# Patient Record
Sex: Male | Born: 1980
Health system: Southern US, Community
[De-identification: ages and names within clinical notes are randomized; demographics above are authoritative.]

## PROBLEM LIST (undated history)

## (undated) DIAGNOSIS — N159 Renal tubulo-interstitial disease, unspecified: Secondary | ICD-10-CM

## (undated) DIAGNOSIS — G935 Compression of brain: Secondary | ICD-10-CM

## (undated) DIAGNOSIS — Z889 Allergy status to unspecified drugs, medicaments and biological substances status: Secondary | ICD-10-CM

## (undated) DIAGNOSIS — K819 Cholecystitis, unspecified: Secondary | ICD-10-CM

## (undated) DIAGNOSIS — K200 Eosinophilic esophagitis: Secondary | ICD-10-CM

## (undated) DIAGNOSIS — K219 Gastro-esophageal reflux disease without esophagitis: Secondary | ICD-10-CM

## (undated) HISTORY — PX: ESOPHAGOGASTRODUODENOSCOPY: SHX1529

## (undated) HISTORY — DX: Gastro-esophageal reflux disease without esophagitis: K21.9

---
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
## 2004-02-23 HISTORY — PX: CHOLECYSTECTOMY: SHX55

## 2011-04-18 ENCOUNTER — Emergency Department: Payer: Self-pay | Admitting: Emergency Medicine

## 2011-05-03 DIAGNOSIS — R519 Headache, unspecified: Secondary | ICD-10-CM | POA: Insufficient documentation

## 2011-06-16 DIAGNOSIS — G935 Compression of brain: Secondary | ICD-10-CM

## 2011-06-16 HISTORY — DX: Compression of brain: G93.5

## 2012-04-13 ENCOUNTER — Ambulatory Visit: Payer: Self-pay

## 2012-04-13 LAB — CBC WITH DIFFERENTIAL/PLATELET
Basophil #: 0 10*3/uL (ref 0.0–0.1)
Basophil %: 0.2 %
Eosinophil #: 0.1 10*3/uL (ref 0.0–0.7)
Eosinophil %: 1 %
HGB: 17.5 g/dL (ref 13.0–18.0)
Lymphocyte #: 0.5 10*3/uL — ABNORMAL LOW (ref 1.0–3.6)
Lymphocyte %: 3.4 %
RBC: 5.41 10*6/uL (ref 4.40–5.90)
RDW: 13.1 % (ref 11.5–14.5)
WBC: 13.1 10*3/uL — ABNORMAL HIGH (ref 3.8–10.6)

## 2012-04-13 LAB — URINALYSIS, COMPLETE
Blood: NEGATIVE
Protein: 100

## 2012-04-13 LAB — AMYLASE: Amylase: 64 U/L (ref 25–115)

## 2012-04-13 LAB — COMPREHENSIVE METABOLIC PANEL
Albumin: 4.9 g/dL (ref 3.4–5.0)
Alkaline Phosphatase: 122 U/L (ref 50–136)
Anion Gap: 12 (ref 7–16)
BUN: 22 mg/dL — ABNORMAL HIGH (ref 7–18)
Bilirubin,Total: 3 mg/dL — ABNORMAL HIGH (ref 0.2–1.0)
Calcium, Total: 10 mg/dL (ref 8.5–10.1)
Co2: 28 mmol/L (ref 21–32)
EGFR (African American): >60
Glucose: 119 mg/dL — ABNORMAL HIGH (ref 65–99)
Osmolality: 280 (ref 275–301)
Potassium: 4.1 mmol/L (ref 3.5–5.1)
SGOT(AST): 32 U/L (ref 15–37)
Total Protein: 9.2 g/dL — ABNORMAL HIGH (ref 6.4–8.2)

## 2012-04-13 LAB — LIPASE, BLOOD: Lipase: 92 U/L (ref 73–393)

## 2012-04-15 LAB — URINE CULTURE

## 2012-06-05 ENCOUNTER — Ambulatory Visit: Payer: Self-pay | Admitting: Gastroenterology

## 2012-06-07 LAB — PATHOLOGY REPORT

## 2013-01-17 ENCOUNTER — Ambulatory Visit: Payer: Self-pay | Admitting: Orthopedic Surgery

## 2013-01-24 ENCOUNTER — Encounter: Payer: Self-pay | Admitting: Orthopedic Surgery

## 2013-02-22 ENCOUNTER — Encounter: Payer: Self-pay | Admitting: Orthopedic Surgery

## 2013-12-19 ENCOUNTER — Encounter: Payer: Self-pay | Admitting: Podiatry

## 2013-12-23 ENCOUNTER — Encounter: Payer: Self-pay | Admitting: Podiatry

## 2014-01-22 ENCOUNTER — Encounter: Payer: Self-pay | Admitting: Podiatry

## 2014-01-30 ENCOUNTER — Ambulatory Visit: Payer: Self-pay | Admitting: Gastroenterology

## 2014-02-22 ENCOUNTER — Encounter: Payer: Self-pay | Admitting: Podiatry

## 2014-06-17 LAB — SURGICAL PATHOLOGY

## 2014-10-07 ENCOUNTER — Encounter: Payer: Self-pay | Admitting: Family Medicine

## 2014-10-07 ENCOUNTER — Ambulatory Visit (INDEPENDENT_AMBULATORY_CARE_PROVIDER_SITE_OTHER): Payer: Self-pay | Admitting: Family Medicine

## 2014-10-07 ENCOUNTER — Other Ambulatory Visit (INDEPENDENT_AMBULATORY_CARE_PROVIDER_SITE_OTHER): Payer: 59

## 2014-10-07 VITALS — BP 130/70 | HR 74 | Wt 171.0 lb

## 2014-10-07 DIAGNOSIS — M25571 Pain in right ankle and joints of right foot: Secondary | ICD-10-CM | POA: Diagnosis not present

## 2014-10-07 NOTE — Progress Notes (Signed)
[facility] [address] [place], [place] [address] Phone: [phone] Subjective:    I'm seeing this patient by the request  of:  Charles Edward, MD   CC: right ankle  [id number] [facility] is a 34 y.o. male coming in with complaint of right ankle pain. Played soccer in May inversion injury, weakness immediatly and swelling with bruising but did get better overtime.  Now running and trying to increase mileage for marathon in October.  Pain laterally and medial and now more constant dull and throbbing at all times with some sharp pain.  Continue to run. Also workout and biking regularly.  Been icing and bracing and Nsaids with improvement. Patient states actually when he tries to run he does get some swelling still. Denies any giving out on him but states that something just does not feel right. Unable to actually increase his time secondary to the pain. Patient remains active multiple different exercises multiple different times a week.  No past medical history on file. No past surgical history on file. Social History  Substance Use Topics  . Smoking status: Not on file  . Smokeless tobacco: Not on file  . Alcohol Use: Not on file   Not on File No family history on file.      Past medical history, social, surgical and family history all reviewed in electronic medical record.   Review of Systems: No headache, visual changes, nausea, vomiting, diarrhea, constipation, dizziness, abdominal pain, skin rash, fevers, chills, night sweats, weight loss, swollen lymph nodes, body aches, joint swelling, muscle aches, chest pain, shortness of breath, mood changes.   Objective Blood pressure 130/70, pulse 74, weight 171 lb (77.565 kg), SpO2 98 %.  General: No apparent distress alert and oriented x3 mood and affect normal, dressed appropriately.  HEENT: Pupils equal, extraocular movements intact  Respiratory: Patient's speak in full sentences and does  not appear short of breath  Cardiovascular: No lower extremity edema, non tender, no erythema  Skin: Warm dry intact with no signs of infection or rash on extremities or on axial skeleton.  Abdomen: Soft nontender  Neuro: Cranial nerves II through XII are intact, neurovascularly intact in all extremities with 2+ DTRs and 2+ pulses.  Lymph: No lymphadenopathy of posterior or anterior cervical chain or axillae bilaterally.  Gait normal with good balance and coordination.  MSK:  Non tender with full range of motion and good stability and symmetric strength and tone of shoulders, elbows, wrist, hip, knees bilaterally.   Ankle: right  Range of motion is full in all directions. Strength is 5/5 in all directions. Stable lateral and medial ligaments; squeeze test and kleiger test unremarkable; Talar dome Moderately tender No pain at base of 5th MT; No tenderness over cuboid; No tenderness over N spot or navicular prominence No tenderness on posterior aspects of lateral and medial malleolus No sign of peroneal tendon subluxations or tenderness to palpation Negative tarsal tunnel tinel's Able to walk 4 steps.  MSK US performed of: right  This study was ordered, performed, and interpreted by Terrilee Files D.O.  Foot/Ankle:   All structures visualized.   Talar dome does have some hypoechoic changes and possible cortical defect noted with mild increasing Doppler flow Ankle mortise trace effusion possible synovitis noted Peroneus longus and brevis tendons unremarkable on long and transverse views without sheath effusions. Posterior tibialis, flexor hallucis longus, and flexor digitorum longus tendons unremarkable on long and transverse views without sheath effusions. Achilles tendon  visualized along length of tendon and unremarkable on long and transverse views without sheath effusion. Anterior Talofibular Ligament and Calcaneofibular Ligaments unremarkable and intact. Deltoid Ligament unremarkable  and intact. Plantar fascia intact and without effusion, normal thickness. No increased doppler signal, cap sign, or thickening of tibial cortex. Power doppler signal normal.  IMPRESSION: possible anterior impingement of the ankle versus talar dome injury       Impression and Recommendations:     This case required medical decision making of moderate complexity.

## 2014-10-07 NOTE — Assessment & Plan Note (Signed)
Patient's right ankle pain continues to give him trouble 3 months after injury. Patient continues to have a recurrent effusion when he does different activities states. There is concern on ultrasound that patient may have a talar dome injury but patient is able to still run 14 miles at a time. Discussed about the potential for an anterior impingement as well. Patient will try conservative therapy including home exercises, continue bracing, anti-inflammatory's and icing. Patient will limit the amount of high intensity exercises he does for the next 2 weeks and then follow-up with me in 3 weeks for further evaluation and treatment. Continuing to have trouble will consider further imaging including x-ray.

## 2014-10-07 NOTE — Patient Instructions (Addendum)
Good to see you.  Ice 20 minutes 2 times daily. Usually after activity and before bed. Exercises 3 times a week.  Avoid running or jumping.  When exercising wear the brace  Vitamin D 4000 IU daily until I see you again.  In week 3 ok to start running again but 50% of what you are doing now in duration and frequency.  See me in 3 weeks and we will make sure you are doing well.

## 2014-10-07 NOTE — Progress Notes (Signed)
Pre visit review using our clinic review tool, if applicable. No additional management support is needed unless otherwise documented below in the visit note. 

## 2014-10-30 ENCOUNTER — Ambulatory Visit (INDEPENDENT_AMBULATORY_CARE_PROVIDER_SITE_OTHER)
Admission: RE | Admit: 2014-10-30 | Discharge: 2014-10-30 | Disposition: A | Discharge status: Home / Self Care | Payer: 59 | Source: Ambulatory Visit | Attending: Family Medicine | Admitting: Family Medicine

## 2014-10-30 ENCOUNTER — Encounter: Payer: Self-pay | Admitting: Family Medicine

## 2014-10-30 ENCOUNTER — Ambulatory Visit (INDEPENDENT_AMBULATORY_CARE_PROVIDER_SITE_OTHER): Payer: 59 | Admitting: Family Medicine

## 2014-10-30 VITALS — BP 134/78 | HR 77 | Wt 173.0 lb

## 2014-10-30 DIAGNOSIS — M25571 Pain in right ankle and joints of right foot: Secondary | ICD-10-CM

## 2014-10-30 NOTE — Progress Notes (Signed)
[facility] [address] [place], [place] [address] Phone: [phone] Subjective:    CC: right ankle follow up  Charles Simmons is a 34 y.o. male coming in with complaint of right ankle pain. Patient was found to have potentially a talar dome problem. Patient has been avoiding any type of a higher intensity exercises including running but continues biking. Denies any  Improvement in the pain. Patient states that it continues to give him discomfort. States that even at night he notices the discomfort. States that maybe the swelling seems to be somewhat better. Denies any new symptoms such as numbness or weakness. Patient though is training over the course the next 3 weeks.  No past medical history on file. No past surgical history on file. Social History  Substance Use Topics  . Smoking status: Not on file  . Smokeless tobacco: Not on file  . Alcohol Use: Not on file   Not on File No family history on file.      Past medical history, social, surgical and family history all reviewed in electronic medical record.   Review of Systems: No headache, visual changes, nausea, vomiting, diarrhea, constipation, dizziness, abdominal pain, skin rash, fevers, chills, night sweats, weight loss, swollen lymph nodes, body aches, joint swelling, muscle aches, chest pain, shortness of breath, mood changes.   Objective Blood pressure 134/78, pulse 77, weight 173 lb (78.472 kg), SpO2 96 %.  General: No apparent distress alert and oriented x3 mood and affect normal, dressed appropriately.  HEENT: Pupils equal, extraocular movements intact  Respiratory: Patient's speak in full sentences and does not appear short of breath  Cardiovascular: No lower extremity edema, non tender, no erythema  Skin: Warm dry intact with no signs of infection or rash on extremities or on axial skeleton.  Abdomen: Soft nontender  Neuro: Cranial nerves II through XII are intact,  neurovascularly intact in all extremities with 2+ DTRs and 2+ pulses.  Lymph: No lymphadenopathy of posterior or anterior cervical chain or axillae bilaterally.  Gait normal with good balance and coordination.  MSK:  Non tender with full range of motion and good stability and symmetric strength and tone of shoulders, elbows, wrist, hip, knees bilaterally.   Ankle: right  Patient does have some limitation in range of motion lacking the last 5 of dorsiflexion and plantarflexion this is new from previous exam Strength is 5/5 in all directions. Stable lateral and medial ligaments; squeeze test and kleiger test unremarkable; Talar dome Moderately tender still present No pain at base of 5th MT; No tenderness over cuboid; No tenderness over N spot or navicular prominence No tenderness on posterior aspects of lateral and medial malleolus No sign of peroneal tendon subluxations or tenderness to palpation Negative tarsal tunnel tinel's Able to walk 4 steps.   MSK US performed of: right  This study was ordered, performed, and interpreted by Terrilee Files D.O.  Foot/Ankle:   All structures visualized.   Talar dome does have some hypoechoic changes and possible cortical defect noted with mild increasing Doppler flow Ankle mortise with decreased effusion with possible talar dome continued hypoechoic changes noted.Marland Kitchen Posterior tibialis, flexor hallucis longus, and flexor digitorum longus tendons unremarkable on long and transverse views without sheath effusions. Achilles tendon visualized along length of tendon and unremarkable on long and transverse views without sheath effusion. Anterior Talofibular Ligament and Calcaneofibular Ligaments unremarkable and intact. Deltoid Ligament unremarkable and intact. Plantar fascia intact and without effusion, normal thickness. No  increased doppler signal, cap sign, or thickening of tibial cortex. Power doppler signal normal.  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

## 2014-10-30 NOTE — Assessment & Plan Note (Addendum)
Patient has not made any significant improvement with conservative therapy at this time. I'm concern for possible OCD. I do feel that further imaging is warranted an x-ray was ordered today. Patient has had limitation in the range of motion and I do think that advance imaging would be warranted as well. Patient will continue with the icing and the over-the-counter natural supplementations. Patient is going to start increasing his training again. We will see patient back again 1-2 days after the MRI and we'll discuss possible treatment accordingly. Goal of running marathon.  Spent  25 minutes with patient face-to-face and had greater than 50% of counseling including as described above in assessment and plan.

## 2014-10-30 NOTE — Patient Instructions (Addendum)
It is good to see you Lets get xray downstairs MRI ordered to give [place] more info.  Ice is your friend 25% of run this week and then increase 25% a week.  Good luck with [place] unless I say otherwise.  Continue the vitamins Look into K2 as another vitamin

## 2014-10-31 ENCOUNTER — Encounter: Payer: Self-pay | Admitting: Family Medicine

## 2014-10-31 ENCOUNTER — Ambulatory Visit
Admission: RE | Admit: 2014-10-31 | Discharge: 2014-10-31 | Disposition: A | Discharge status: Home / Self Care | Payer: 59 | Source: Ambulatory Visit | Attending: Family Medicine | Admitting: Family Medicine

## 2014-10-31 DIAGNOSIS — M24271 Disorder of ligament, right ankle: Secondary | ICD-10-CM | POA: Insufficient documentation

## 2014-10-31 DIAGNOSIS — M25571 Pain in right ankle and joints of right foot: Secondary | ICD-10-CM | POA: Diagnosis present

## 2014-10-31 DIAGNOSIS — M67873 Other specified disorders of tendon, right ankle and foot: Secondary | ICD-10-CM | POA: Diagnosis not present

## 2014-11-07 ENCOUNTER — Ambulatory Visit: Payer: 59

## 2014-11-08 ENCOUNTER — Other Ambulatory Visit (INDEPENDENT_AMBULATORY_CARE_PROVIDER_SITE_OTHER): Payer: 59

## 2014-11-08 ENCOUNTER — Encounter: Payer: Self-pay | Admitting: Family Medicine

## 2014-11-08 ENCOUNTER — Ambulatory Visit (INDEPENDENT_AMBULATORY_CARE_PROVIDER_SITE_OTHER): Payer: 59 | Admitting: Family Medicine

## 2014-11-08 VITALS — BP 124/80 | HR 67 | Wt 173.0 lb

## 2014-11-08 DIAGNOSIS — M7671 Peroneal tendinitis, right leg: Secondary | ICD-10-CM | POA: Insufficient documentation

## 2014-11-08 DIAGNOSIS — M25571 Pain in right ankle and joints of right foot: Secondary | ICD-10-CM

## 2014-11-08 NOTE — Progress Notes (Signed)
[name] [address] [place], [place] [address] Phone: [phone] Subjective:    CC: right ankle follow up  Charles Simmons is a 34 y.o. male coming in with complaint of right ankle pain. Patient continued to have right ankle pain and initially had significant amount swelling. Patient continued and pain over the talar dome itself appeared. Patient was sent for an MRI. MRI was reviewed by me. Patient did have severe thickening of the anterior tibiofibular ligament as well as some focal tendinosis of the peroneal brevis and appeared the patient also had a tertiary peroneal tendon anterior to the lateral fibula. Patient continues to have pain he states. Is trying to run and is doing relatively well. More discomfort still on the anterior aspect of the ankle but no significant swelling.  No past medical history on file. No past surgical history on file. Social History  Substance Use Topics  . Smoking status: Never Smoker   . Smokeless tobacco: None  . Alcohol Use: None   Not on File No family history on file.      Past medical history, social, surgical and family history all reviewed in electronic medical record.   Review of Systems: No headache, visual changes, nausea, vomiting, diarrhea, constipation, dizziness, abdominal pain, skin rash, fevers, chills, night sweats, weight loss, swollen lymph nodes, body aches, joint swelling, muscle aches, chest pain, shortness of breath, mood changes.   Objective Blood pressure 124/80, pulse 67, weight 173 lb (78.472 kg), SpO2 97 %.  General: No apparent distress alert and oriented x3 mood and affect normal, dressed appropriately.  HEENT: Pupils equal, extraocular movements intact  Respiratory: Patient's speak in full sentences and does not appear short of breath  Cardiovascular: No lower extremity edema, non tender, no erythema  Skin: Warm dry intact with no signs of infection or rash on  extremities or on axial skeleton.  Abdomen: Soft nontender  Neuro: Cranial nerves II through XII are intact, neurovascularly intact in all extremities with 2+ DTRs and 2+ pulses.  Lymph: No lymphadenopathy of posterior or anterior cervical chain or axillae bilaterally.  Gait normal with good balance and coordination.  MSK:  Non tender with full range of motion and good stability and symmetric strength and tone of shoulders, elbows, wrist, hip, knees bilaterally.   Ankle: right  Still tightness but increased range of motion from previous exam. Strength is 5/5 in all directions. Stable lateral and medial ligaments; squeeze test and kleiger test unremarkable; Talar dome and over the tertiary peroneal tendon No pain at base of 5th MT; No tenderness over cuboid; No tenderness over N spot or navicular prominence No tenderness on posterior aspects of lateral and medial malleolus No sign of peroneal tendon subluxations or tenderness to palpation Negative tarsal tunnel tinel's Able to walk 4 steps.   Procedure: Real-time Ultrasound Guided Injection of right ankle tertiary peroneal tendon sheath Device: GE Logiq E  Ultrasound guided injection is preferred based studies that show increased duration, increased effect, greater accuracy, decreased procedural pain, increased response rate, and decreased cost with ultrasound guided versus blind injection.  Verbal informed consent obtained.  Time-out conducted.  Noted no overlying erythema, induration, or other signs of local infection.  Skin prepped in a sterile fashion.  Local anesthesia: Topical Ethyl chloride.  With sterile technique and under real time ultrasound guidance: With a 25-gauge half-inch needle patient was injected with a total of 0.5 mL of 0.5% Marcaine and 0.5 mL of Kenalog  40 mg/dL  Completed without difficulty  Pain immediately resolved suggesting accurate placement of the medication.  Advised to call if fevers/chills, erythema,  induration, drainage, or persistent bleeding.  Images permanently stored and available for review in the ultrasound unit.  Impression: Technically successful ultrasound guided injection.       Impression and Recommendations:     This case required medical decision making of moderate complexity.

## 2014-11-08 NOTE — Progress Notes (Signed)
Pre visit review using our clinic review tool, if applicable. No additional management support is needed unless otherwise documented below in the visit note. 

## 2014-11-08 NOTE — Assessment & Plan Note (Signed)
Patient injected today and tolerated procedure well. We discussed icing regimen and home exercises. We discussed which activities to doing which was potentially avoid. Patient will try to make these different changes and come back and see me again after his race.

## 2014-11-08 NOTE — Patient Instructions (Signed)
Good to see you Ice is your friend Ice after running always.  Continue the exercises and continue the regimen we discussed.  Injection was marcaine and kenalog  We injected tertiary peroneal tendon.  See me again after the race!

## 2014-12-03 ENCOUNTER — Encounter: Payer: Self-pay | Admitting: Family Medicine

## 2014-12-03 ENCOUNTER — Ambulatory Visit (INDEPENDENT_AMBULATORY_CARE_PROVIDER_SITE_OTHER): Payer: 59 | Admitting: Family Medicine

## 2014-12-03 VITALS — BP 120/74 | HR 55

## 2014-12-03 DIAGNOSIS — M6281 Muscle weakness (generalized): Secondary | ICD-10-CM | POA: Diagnosis not present

## 2014-12-03 DIAGNOSIS — Z23 Encounter for immunization: Secondary | ICD-10-CM | POA: Diagnosis not present

## 2014-12-03 NOTE — Progress Notes (Signed)
Pre visit review using our clinic review tool, if applicable. No additional management support is needed unless otherwise documented below in the visit note. 

## 2014-12-03 NOTE — Progress Notes (Signed)
  [name] [address] [place], [place] [address] Phone: [phone] Subjective:    CC: right ankle follow up  [id number] COYT GOVONI is a 34 y.o. male coming in with complaint of right ankle pain. Patient continued to have right ankle pain and initially had significant amount swelling. Patient continued and pain over the talar dome itself appeared. Patient was sent for an MRI. MRI was reviewed by me. Patient did have severe thickening of the anterior tibiofibular ligament as well as some focal tendinosis of the peroneal brevis .  Patient was given an injection and states that he was doing somewhat better for some time and then ran the marathon. When patient ran the marathon he had increasing pain and stiffness for multiple weeks. Seems to be improving at this time. His avoiding any significant running other than his regular routine. Denies any new symptoms. Patient is taking a break from any long-distance running for quite some time and would like to heal himself over the course of time.  No past medical history on file. No past surgical history on file. Social History  Substance Use Topics  . Smoking status: Never Smoker   . Smokeless tobacco: None  . Alcohol Use: None   Not on File No family history on file.      Past medical history, social, surgical and family history all reviewed in electronic medical record.   Review of Systems: No headache, visual changes, nausea, vomiting, diarrhea, constipation, dizziness, abdominal pain, skin rash, fevers, chills, night sweats, weight loss, swollen lymph nodes, body aches, joint swelling, muscle aches, chest pain, shortness of breath, mood changes.   Objective Blood pressure 120/74, pulse 55, SpO2 98 %.  General: No apparent distress alert and oriented x3 mood and affect normal, dressed appropriately.  HEENT: Pupils equal, extraocular movements intact  Respiratory: Patient's speak in full sentences  and does not appear short of breath  Cardiovascular: No lower extremity edema, non tender, no erythema  Skin: Warm dry intact with no signs of infection or rash on extremities or on axial skeleton.  Abdomen: Soft nontender  Neuro: Cranial nerves II through XII are intact, neurovascularly intact in all extremities with 2+ DTRs and 2+ pulses.  Lymph: No lymphadenopathy of posterior or anterior cervical chain or axillae bilaterally.  Gait normal with good balance and coordination.  MSK:  Non tender with full range of motion and good stability and symmetric strength and tone of shoulders, elbows, wrist, hip, knees bilaterally.   Patient did bring video of patient's running the [facility] and patient's left calf does not contract.  Ankle: right  Increased range of motion from previous exam. Strength is 5/5 in all directions. Stable lateral and medial ligaments; squeeze test and kleiger test unremarkable; Pain over the peroneal tendon still noted No pain at base of 5th MT; No tenderness over cuboid; No tenderness over N spot or navicular prominence No tenderness on posterior aspects of lateral and medial malleolus No sign of peroneal tendon subluxations or tenderness to palpation Negative tarsal tunnel tinel's Able to walk 4 steps. Left calf muscle on exam seems to do all right with sitting and strength testing.    Impression and Recommendations:     This case required medical decision making of moderate complexity.

## 2014-12-03 NOTE — Patient Instructions (Signed)
Good to see you and congrats!!! New exercises for the calf On step drop heels as far as you can then up on toes, hold 2 seconds down slow for count of 4 seconds, 30 reps daily for 1st week, then 2 sets daily for 2nd week and then 3 sets daily thereafter OK to bike and to elliptical.  Ice at the end of exercises Consider a TENS unit at this time. [employer] See me again in 4 weeks

## 2014-12-03 NOTE — Assessment & Plan Note (Signed)
Believe that actually patient's ankle pain is secondary to his calf not firing correctly. We discussed with patient about treating the contralateral calf muscle. Patient given home exercises, icing protocol, we discussed compression sleeve as well as possible electrical stimulation I could be beneficial. Patient is to try to make these changes and come back in 4 weeks for further evaluation and will given exercise prescription to increase his activity as tolerated.

## 2014-12-28 ENCOUNTER — Emergency Department
Admission: EM | Admit: 2014-12-28 | Discharge: 2014-12-28 | Disposition: A | Discharge status: Home / Self Care | Payer: 59 | Attending: Emergency Medicine | Admitting: Emergency Medicine

## 2014-12-28 ENCOUNTER — Encounter: Payer: Self-pay | Admitting: Emergency Medicine

## 2014-12-28 DIAGNOSIS — Z23 Encounter for immunization: Secondary | ICD-10-CM | POA: Insufficient documentation

## 2014-12-28 DIAGNOSIS — W260XXA Contact with knife, initial encounter: Secondary | ICD-10-CM | POA: Insufficient documentation

## 2014-12-28 DIAGNOSIS — Z79899 Other long term (current) drug therapy: Secondary | ICD-10-CM | POA: Insufficient documentation

## 2014-12-28 DIAGNOSIS — Z7951 Long term (current) use of inhaled steroids: Secondary | ICD-10-CM | POA: Diagnosis not present

## 2014-12-28 DIAGNOSIS — Y9389 Activity, other specified: Secondary | ICD-10-CM | POA: Insufficient documentation

## 2014-12-28 DIAGNOSIS — Y998 Other external cause status: Secondary | ICD-10-CM | POA: Diagnosis not present

## 2014-12-28 DIAGNOSIS — Y9289 Other specified places as the place of occurrence of the external cause: Secondary | ICD-10-CM | POA: Diagnosis not present

## 2014-12-28 DIAGNOSIS — S61012A Laceration without foreign body of left thumb without damage to nail, initial encounter: Secondary | ICD-10-CM | POA: Insufficient documentation

## 2014-12-28 MED ORDER — TETANUS-DIPHTH-ACELL PERTUSSIS 5-2.5-18.5 LF-MCG/0.5 IM SUSP
0.5000 mL | Freq: Once | INTRAMUSCULAR | Status: AC
  Administered 2014-12-28: 0.5 mL via INTRAMUSCULAR
  Filled 2014-12-28: qty 0.5

## 2014-12-28 NOTE — ED Notes (Signed)
Discussed discharge instructions and follow-up care with patient. No questions or concerns at this time. Pt stable at discharge.  

## 2014-12-28 NOTE — ED Notes (Signed)
Pt reports left thumb with kitchen knife. Pt reports numbness to end of thumb. Pt reports it's been bleeding for 45 minutes; unsure of last tetanus shot.

## 2014-12-28 NOTE — ED Provider Notes (Signed)
Minimally Invasive Surgery [facility] Emergency Department Provider Note  ____________________________________________  Time seen: Approximately 4:24 PM  I have reviewed the triage vital signs and the nursing notes.   HISTORY  Chief Complaint Laceration  HPI Charles Simmons is a 34 y.o. male is here with laceration to his left thumb with a kitchen knife this afternoon. Patient states that he applied pressure but area continued to bleed. He is also unsure of his last tetanus shot. Bleeding is now controlled with pressure. His pain level is a 6 out of 10.   History reviewed. No pertinent past medical history.  Patient Active Problem List   Diagnosis Date Noted  . Calf muscle weakness 12/03/2014  . Peroneal tendinitis of right lower extremity 11/08/2014  . Right ankle pain 10/07/2014  . Arnold-Chiari malformation, type I (HCC) 06/16/2011  . Cephalalgia 05/03/2011    Past Surgical History  Procedure Laterality Date  . Cholecystectomy  2006    Current Outpatient Rx  Name  Route  Sig  Dispense  Refill  . cetirizine (ZYRTEC) 10 MG tablet   Oral   Take by mouth.         . fluticasone (FLONASE) 50 MCG/ACT nasal spray   Nasal   Place into the nose.         Marland Kitchen. omeprazole-sodium bicarbonate (ZEGERID) 40-1100 MG per capsule   Oral   Take by mouth.           Allergies Review of patient's allergies indicates not on file.  No family history on file.  Social History Social History  Substance Use Topics  . Smoking status: Never Smoker   . Smokeless tobacco: None  . Alcohol Use: No    Review of Systems Constitutional: No fever/chills Cardiovascular: Denies chest pain. Respiratory: Denies shortness of breath. Gastrointestinal:  No nausea, no vomiting.   Musculoskeletal: Negative for back pain. Skin: Positive laceration Neurological: Negative for headaches, focal weakness or numbness.  10-point ROS otherwise  negative.  ____________________________________________   PHYSICAL EXAM:  VITAL SIGNS: ED Triage Vitals  Enc Vitals Group     BP 12/28/14 1533 144/87 mmHg     Pulse Rate 12/28/14 1533 79     Resp 12/28/14 1533 16     Temp 12/28/14 1533 97.6 F (36.4 C)     Temp Source 12/28/14 1533 Oral     SpO2 12/28/14 1533 98 %     Weight 12/28/14 1533 160 lb (72.576 kg)     Height --      Head Cir --      Peak Flow --      Pain Score 12/28/14 1533 6     Pain Loc --      Pain Edu? --      Excl. in GC? --     Constitutional: Alert and oriented. Well appearing and in no acute distress. Eyes: Conjunctivae are normal. PERRL. EOMI. Head: Atraumatic. Nose: No congestion/rhinnorhea. Neck: No stridor.   Cardiovascular: Normal rate, regular rhythm. Grossly normal heart sounds.  Good peripheral circulation. Respiratory: Normal respiratory effort.  No retractions. Lungs CTAB. Gastrointestinal: Soft and nontender. No distention. Musculoskeletal: No lower extremity tenderness nor edema.  No joint effusions. Neurologic:  Normal speech and language. No gross focal neurologic deficits are appreciated. No gait instability. Skin:  Skin is warm, dry and intact. There is a very superficial flap laceration at the distal portion of the left thumb. No active bleeding at present. Psychiatric: Mood and affect are normal. Speech and behavior  are normal.  ____________________________________________   LABS (all labs ordered are listed, but only abnormal results are displayed)  Labs Reviewed - No data to display    PROCEDURES  Procedure(s) performed:LACERATION REPAIR Performed by: Tommi Rumps Authorized by: Tommi Rumps Consent: Verbal consent obtained. Risks and benefits: risks, benefits and alternatives were discussed Consent given by: patient Patient identity confirmed: provided demographic data Prepped and Draped in normal sterile fashion Wound explored  Laceration Location: Left  thumb distal portion  Laceration Length: 1 cm flap  No Foreign Bodies seen or palpated No anesthesia was used during this procedure. Irrigation method: syringe Amount of cleaning: standard  Skin closure: 5-0 Ethilon   Number of sutures: 1   Technique: Simple interrupted   Patient tolerance: Patient tolerated the procedure well with no immediate complications.  Critical Care performed: No  ____________________________________________   INITIAL IMPRESSION / ASSESSMENT AND PLAN / ED COURSE  Pertinent labs & imaging results that were available during my care of the patient were reviewed by me and considered in my medical decision making (see chart for details).  Patient was given information on how to take care of his laceration site. He was instructed to return to the emergency room or go to his family doctor for suture removal in 10 days. He may also take Tylenol or ibuprofen as needed for pain. ____________________________________________   FINAL CLINICAL IMPRESSION(S) / ED DIAGNOSES  Final diagnoses:  Laceration of left thumb, initial encounter      Tommi Rumps, PA-C 12/28/14 1653  Jene Every, MD 12/28/14 [phone]

## 2014-12-28 NOTE — Discharge Instructions (Signed)

## 2015-06-02 ENCOUNTER — Ambulatory Visit: Payer: Self-pay | Admitting: Physician Assistant

## 2015-06-02 ENCOUNTER — Ambulatory Visit
Admission: RE | Admit: 2015-06-02 | Discharge: 2015-06-02 | Disposition: A | Discharge status: Home / Self Care | Payer: 59 | Source: Ambulatory Visit | Attending: Physician Assistant | Admitting: Physician Assistant

## 2015-06-02 ENCOUNTER — Encounter: Payer: Self-pay | Admitting: Physician Assistant

## 2015-06-02 VITALS — BP 119/80 | HR 72 | Temp 97.1°F

## 2015-06-02 DIAGNOSIS — M79644 Pain in right finger(s): Secondary | ICD-10-CM | POA: Diagnosis not present

## 2015-06-02 NOTE — Progress Notes (Signed)
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

## 2015-07-24 DIAGNOSIS — Z8371 Family history of colonic polyps: Secondary | ICD-10-CM | POA: Diagnosis not present

## 2015-07-24 DIAGNOSIS — K219 Gastro-esophageal reflux disease without esophagitis: Secondary | ICD-10-CM | POA: Diagnosis not present

## 2015-07-24 DIAGNOSIS — R1310 Dysphagia, unspecified: Secondary | ICD-10-CM | POA: Diagnosis not present

## 2015-10-30 ENCOUNTER — Ambulatory Visit: Payer: Self-pay | Admitting: Physician Assistant

## 2015-10-30 ENCOUNTER — Encounter: Payer: Self-pay | Admitting: Physician Assistant

## 2015-10-30 VITALS — BP 130/84 | HR 72 | Temp 98.0°F

## 2015-10-30 DIAGNOSIS — M778 Other enthesopathies, not elsewhere classified: Secondary | ICD-10-CM

## 2015-10-30 NOTE — Progress Notes (Signed)
S: c/o r wrist and hand pain, increased pain when shakes someone's hand, feels harsh pain along ulnar side of hand and wrist, increased pain with movement, no known injury, is r handed, sx for 2 weeks  O: vitals wnl, nad, skin intact no redness or bruising, full rom of wrist, hand, and fingers, tender along distal ulna and into carpal and 5th metacarpal bones, pain reproduced with supination and dorsiflexion of hand/wrist  A: tendonitis  P: wrist splint, nsaids, otc turmeric, ice, if not better in 2 weeks will refer to ortho

## 2015-12-18 DIAGNOSIS — G5621 Lesion of ulnar nerve, right upper limb: Secondary | ICD-10-CM | POA: Diagnosis not present

## 2016-01-03 DIAGNOSIS — G5621 Lesion of ulnar nerve, right upper limb: Secondary | ICD-10-CM | POA: Diagnosis not present

## 2016-01-09 DIAGNOSIS — G5621 Lesion of ulnar nerve, right upper limb: Secondary | ICD-10-CM | POA: Diagnosis not present

## 2017-01-07 ENCOUNTER — Encounter: Payer: Self-pay | Admitting: Internal Medicine

## 2017-01-12 ENCOUNTER — Ambulatory Visit: Payer: Self-pay | Admitting: Internal Medicine

## 2017-01-31 ENCOUNTER — Ambulatory Visit: Payer: Self-pay | Admitting: Internal Medicine

## 2017-02-22 HISTORY — PX: ULNAR NERVE TRANSPOSITION: SHX2595

## 2017-02-25 ENCOUNTER — Encounter: Payer: Self-pay | Admitting: Internal Medicine

## 2017-02-25 ENCOUNTER — Ambulatory Visit: Payer: BC Managed Care – PPO | Admitting: Internal Medicine

## 2017-02-25 VITALS — BP 118/78 | HR 62 | Ht 70.0 in | Wt 179.0 lb

## 2017-02-25 DIAGNOSIS — G5621 Lesion of ulnar nerve, right upper limb: Secondary | ICD-10-CM | POA: Diagnosis not present

## 2017-02-25 DIAGNOSIS — K200 Eosinophilic esophagitis: Secondary | ICD-10-CM

## 2017-02-25 DIAGNOSIS — G935 Compression of brain: Secondary | ICD-10-CM | POA: Diagnosis not present

## 2017-02-25 DIAGNOSIS — Z23 Encounter for immunization: Secondary | ICD-10-CM | POA: Diagnosis not present

## 2017-02-25 HISTORY — DX: Eosinophilic esophagitis: K20.0

## 2017-02-25 NOTE — Progress Notes (Signed)
Date:  02/25/2017   Name:  Charles GottronBenjamin C Simmons   DOB:  [birth year]   MRN:  [id number]   Chief Complaint: Establish Care (Reestablish) and Immunizations (Flu Shot) Generally doing well.  He is planning ulnar nerve decompression surgery in February.  He needs a flu shot.   Gastroesophageal Reflux  He complains of heartburn. He reports no abdominal pain, no chest pain, no nausea or no wheezing. This is a recurrent problem. The problem occurs occasionally. Pertinent negatives include no fatigue. He has tried a PPI for the symptoms. The treatment provided significant relief. Past procedures include an EGD (hx of eosinophilic esophagitis).    Review of Systems  Constitutional: Negative for chills, fatigue and fever.  Eyes: Negative for visual disturbance.  Respiratory: Negative for chest tightness, shortness of breath and wheezing.   Cardiovascular: Negative for chest pain and palpitations.  Gastrointestinal: Positive for heartburn. Negative for abdominal pain, diarrhea, nausea and vomiting.  Musculoskeletal: Negative for arthralgias.  Neurological: Positive for numbness (in right hand from ulnar nerve compression).  Hematological: Negative for adenopathy.  Psychiatric/Behavioral: Negative for dysphoric mood and sleep disturbance.    Patient Active Problem List   Diagnosis Date Noted  . Eosinophilic esophagitis 02/25/2017  . Calf muscle weakness 12/03/2014  . Peroneal tendinitis of right lower extremity 11/08/2014  . Right ankle pain 10/07/2014  . Arnold-Chiari malformation, type I (HCC) 06/16/2011    Prior to Admission medications   Medication Sig Start Date End Date Taking? Authorizing Provider  omeprazole (PRILOSEC) 40 MG capsule Take 20 mg by mouth daily.  12/23/14  Yes [provider]  sodium bicarbonate, antacid, POWD Take by mouth.   Yes [provider]    No Known Allergies  Past Surgical History:  Procedure Laterality Date  . CHOLECYSTECTOMY  2006     Social History   Tobacco Use  . Smoking status: Never Smoker  . Smokeless tobacco: Never Used  Substance Use Topics  . Alcohol use: Yes    Alcohol/week: 3.0 - 4.2 oz    Types: 5 - 7 Cans of beer per week  . Drug use: No     Medication list has been reviewed and updated.  PHQ 2/9 Scores 02/25/2017  PHQ - 2 Score 0    Physical Exam  Constitutional: He is oriented to person, place, and time. He appears well-developed. No distress.  HENT:  Head: Normocephalic and atraumatic.  Neck: Normal range of motion. Neck supple. Carotid bruit is not present. No thyromegaly present.  Cardiovascular: Normal rate, regular rhythm and normal heart sounds.  Pulmonary/Chest: Effort normal and breath sounds normal. No respiratory distress. He has no decreased breath sounds. He has no wheezes. He has no rhonchi.  Musculoskeletal: Normal range of motion.  Neurological: He is alert and oriented to person, place, and time.  Skin: Skin is warm and dry. No rash noted.  Psychiatric: He has a normal mood and affect. His speech is normal and behavior is normal. Thought content normal.  Nursing note and vitals reviewed.   BP 118/78   Pulse 62   Ht 5\' 10"  (1.778 m)   Wt 179 lb (81.2 kg)   SpO2 97%   BMI 25.68 kg/m   Assessment and Plan: 1. Ulnar nerve entrapment at elbow, right Planning surgery  2. Eosinophilic esophagitis Continue PPI  3. Arnold-Chiari malformation, type I (HCC) asx  4. Need for influenza vaccination - Flu Vaccine QUAD 36+ mos IM   No orders of the defined types  were placed in this encounter.   Partially dictated using Animal nutritionist. Any errors are unintentional.  Bari Edward, MD [facility]  02/25/2017

## 2017-04-22 HISTORY — PX: ELBOW SURGERY: SHX618

## 2017-05-28 ENCOUNTER — Encounter: Payer: Self-pay | Admitting: Internal Medicine

## 2018-01-27 ENCOUNTER — Encounter: Payer: Self-pay | Admitting: Internal Medicine

## 2018-01-27 ENCOUNTER — Ambulatory Visit: Payer: BC Managed Care – PPO | Admitting: Internal Medicine

## 2018-01-27 VITALS — BP 120/80 | HR 68 | Ht 70.0 in | Wt 181.0 lb

## 2018-01-27 DIAGNOSIS — G4489 Other headache syndrome: Secondary | ICD-10-CM

## 2018-01-27 DIAGNOSIS — G935 Compression of brain: Secondary | ICD-10-CM | POA: Diagnosis not present

## 2018-01-27 DIAGNOSIS — Z23 Encounter for immunization: Secondary | ICD-10-CM

## 2018-01-27 NOTE — Progress Notes (Signed)
Date:  01/27/2018   Name:  Charles Simmons   DOB:  05/05/1980   MRN:  [id number]   Chief Complaint: Headache (having HA daily- pressure to sharp pain. Has noticed some confusion and forgetfullness/ forgetting people's names that he should know. HA is there "all day long"- changes in severity/ goes from slight to moderate. Taking Motrin- helps, taking mainly at night) and Flu Vaccine Unable to find a common word in speaking, changing the order of word in writing or speaking over the past 4-6 months. New headaches around the head - band like, constant, relieved by ibuprofen.  Occasional floater but no light sensitivity.  No nausea or vomiting.  Occasional dizziness and mild double vision.  Was seen for HA at [facility] around 2012 - had 5-6 mm herniation on MRI c/w AC-1 malformation.  He has not had follow up since, just dealing with occasional headaches until the past few months. HPI  Review of Systems  Constitutional: Negative for chills, fatigue, fever and unexpected weight change.  Respiratory: Negative for chest tightness, shortness of breath and wheezing.   Cardiovascular: Negative for chest pain and palpitations.  Musculoskeletal: Positive for arthralgias (recent elbow surgery).  Neurological: Positive for dizziness, speech difficulty (word finding trouble at times) and headaches. Negative for tremors, seizures, weakness and light-headedness.  Psychiatric/Behavioral: Positive for decreased concentration. Negative for agitation, confusion, hallucinations and sleep disturbance. The patient is not nervous/anxious.     Patient Active Problem List   Diagnosis Date Noted  . Eosinophilic esophagitis 02/25/2017  . Calf muscle weakness 12/03/2014  . Peroneal tendinitis of right lower extremity 11/08/2014  . Right ankle pain 10/07/2014  . Arnold-Chiari malformation, type I (HCC) 06/16/2011    No Known Allergies  Past Surgical History:  Procedure Laterality Date  . CHOLECYSTECTOMY  2006    . ELBOW SURGERY Right 04/2017   cubital tunnel release    Social History   Tobacco Use  . Smoking status: Never Smoker  . Smokeless tobacco: Never Used  Substance Use Topics  . Alcohol use: Yes    Alcohol/week: 5.0 - 7.0 standard drinks    Types: 5 - 7 Cans of beer per week  . Drug use: No     Medication list has been reviewed and updated.  Current Meds  Medication Sig  . omeprazole (PRILOSEC) 20 MG capsule Take 20 mg by mouth daily.     PHQ 2/9 Scores 02/25/2017  PHQ - 2 Score 0    Physical Exam  Constitutional: He is oriented to person, place, and time. He appears well-developed. No distress.  HENT:  Head: Normocephalic and atraumatic.  Eyes: Pupils are equal, round, and reactive to light.  Neck: Normal range of motion. Neck supple.  Cardiovascular: Normal rate, regular rhythm and normal heart sounds.  Pulmonary/Chest: Effort normal. No respiratory distress.  Musculoskeletal: Normal range of motion.  Lymphadenopathy:    He has no cervical adenopathy.  Neurological: He is alert and oriented to person, place, and time. He has normal strength. Coordination and gait normal.  Skin: Skin is warm and dry. No rash noted.  Psychiatric: He has a normal mood and affect. His behavior is normal. Thought content normal.  Nursing note and vitals reviewed.   BP 120/80   Pulse 68   Ht 5\' 10"  (1.778 m)   Wt 181 lb (82.1 kg)   SpO2 98%   BMI 25.97 kg/m   Assessment and Plan: 1. Arnold-Chiari malformation, type I (HCC) Needs MRI study -  MR Brain W Wo Contrast; Future  2. Other headache syndrome Likely related to the above Continue advil as needed - MR Brain W Wo Contrast; Future  3. Need for immunization against influenza - Flu Vaccine QUAD 36+ mos IM   Partially dictated using Animal nutritionistDragon software. Any errors are unintentional.  Bari EdwardLaura Yarelly Kuba, MD [facility]  01/27/2018

## 2018-02-17 ENCOUNTER — Ambulatory Visit: Admission: RE | Admit: 2018-02-17 | Payer: BC Managed Care – PPO | Source: Ambulatory Visit

## 2018-02-21 ENCOUNTER — Ambulatory Visit
Admission: RE | Admit: 2018-02-21 | Discharge: 2018-02-21 | Disposition: A | Discharge status: Home / Self Care | Payer: BC Managed Care – PPO | Source: Ambulatory Visit | Attending: Internal Medicine | Admitting: Internal Medicine

## 2018-02-21 DIAGNOSIS — G935 Compression of brain: Secondary | ICD-10-CM | POA: Diagnosis present

## 2018-02-21 DIAGNOSIS — G4489 Other headache syndrome: Secondary | ICD-10-CM | POA: Insufficient documentation

## 2018-02-21 MED ORDER — GADOBUTROL 1 MMOL/ML IV SOLN
8.3000 mL | Freq: Once | INTRAVENOUS | Status: AC | PRN
  Administered 2018-02-21: 8.3 mL via INTRAVENOUS

## 2018-02-23 ENCOUNTER — Encounter: Payer: Self-pay | Admitting: Internal Medicine

## 2018-08-31 ENCOUNTER — Other Ambulatory Visit: Payer: Self-pay

## 2018-08-31 ENCOUNTER — Encounter: Payer: Self-pay | Admitting: Internal Medicine

## 2018-08-31 ENCOUNTER — Ambulatory Visit (INDEPENDENT_AMBULATORY_CARE_PROVIDER_SITE_OTHER): Payer: 59 | Admitting: Internal Medicine

## 2018-08-31 VITALS — BP 112/68 | HR 75 | Ht 70.0 in | Wt 175.0 lb

## 2018-08-31 DIAGNOSIS — K200 Eosinophilic esophagitis: Secondary | ICD-10-CM

## 2018-08-31 DIAGNOSIS — J329 Chronic sinusitis, unspecified: Secondary | ICD-10-CM | POA: Insufficient documentation

## 2018-08-31 DIAGNOSIS — Z1211 Encounter for screening for malignant neoplasm of colon: Secondary | ICD-10-CM | POA: Diagnosis not present

## 2018-08-31 DIAGNOSIS — Z0000 Encounter for general adult medical examination without abnormal findings: Secondary | ICD-10-CM

## 2018-08-31 DIAGNOSIS — G935 Compression of brain: Secondary | ICD-10-CM | POA: Diagnosis not present

## 2018-08-31 DIAGNOSIS — J322 Chronic ethmoidal sinusitis: Secondary | ICD-10-CM

## 2018-08-31 LAB — POCT URINALYSIS DIPSTICK
Bilirubin, UA: NEGATIVE
Blood, UA: NEGATIVE
Glucose, UA: NEGATIVE
Ketones, UA: NEGATIVE
Leukocytes, UA: NEGATIVE
Nitrite, UA: NEGATIVE
Protein, UA: NEGATIVE
Spec Grav, UA: 1.01 (ref 1.010–1.025)
Urobilinogen, UA: 0.2 E.U./dL
pH, UA: 6.5 (ref 5.0–8.0)

## 2018-08-31 NOTE — Progress Notes (Signed)
Date:  08/31/2018   Name:  Charles BrunnerBenjamin Clay Simmons   DOB:  1980/03/27   MRN:  [id number]   Chief Complaint: Annual Exam Wardell HeathBenjamin Clay Sunny SchleinWooten is a 38 y.o. male who presents today for his Complete Annual Exam. He feels well. He reports exercising regularly. He reports he is sleeping well.   He started back working for [employer] in march and has been very busy.  Gastroesophageal Reflux He complains of heartburn. He reports no abdominal pain, no chest pain, no choking or no wheezing. This is a recurrent problem. The problem occurs occasionally. Pertinent negatives include no fatigue. He has tried a PPI for the symptoms. The treatment provided moderate relief.   Headaches - he still gets mild headaches occasionally.  The more chronic headaches noted last year have [facility] the chronic sinusitis seen on MRI.  There was no evidence of Arnold-Chiari malformation that was diagnosed previously.  Word finding issues - he still notes that he gets left and right confused and has trouble with words at times.  It is not worsening and he no other symptoms.  He believes that he is mildly dyslexic but does not desire any further evaluation at this time.   Review of Systems  Constitutional: Negative for appetite change, chills, diaphoresis, fatigue and unexpected weight change.  HENT: Positive for sinus pressure. Negative for hearing loss, postnasal drip, tinnitus, trouble swallowing and voice change.   Eyes: Negative for visual disturbance.  Respiratory: Negative for choking, shortness of breath and wheezing.   Cardiovascular: Negative for chest pain, palpitations and leg swelling.  Gastrointestinal: Positive for heartburn. Negative for abdominal pain, blood in stool, constipation and diarrhea.  Genitourinary: Negative for difficulty urinating, dysuria, frequency and testicular pain.  Musculoskeletal: Negative for arthralgias, back pain and myalgias.  Skin: Negative for color change and rash.   Allergic/Immunologic: Positive for environmental allergies.  Neurological: Positive for headaches (occasional mild). Negative for dizziness, syncope and light-headedness.  Hematological: Negative for adenopathy.  Psychiatric/Behavioral: Positive for confusion (mild word confusion at times). Negative for dysphoric mood and sleep disturbance. The patient is not nervous/anxious.     Patient Active Problem List   Diagnosis Date Noted  . Chronic sinusitis, unspecified 08/31/2018  . Other headache syndrome 01/27/2018  . Eosinophilic esophagitis 02/25/2017  . Calf muscle weakness 12/03/2014  . Peroneal tendinitis of right lower extremity 11/08/2014  . Arnold-Chiari malformation, type I (HCC) 06/16/2011    Allergies  Allergen Reactions  . Gadolinium Derivatives Hives    Past Surgical History:  Procedure Laterality Date  . CHOLECYSTECTOMY  2006  . ELBOW SURGERY Right 04/2017   cubital tunnel release    Social History   Tobacco Use  . Smoking status: Never Smoker  . Smokeless tobacco: Never Used  Substance Use Topics  . Alcohol use: Yes    Alcohol/week: 5.0 - 7.0 standard drinks    Types: 5 - 7 Cans of beer per week  . Drug use: No     Medication list has been reviewed and updated.  Current Meds  Medication Sig  . cetirizine (ZYRTEC) 10 MG tablet Take by mouth.  . fluticasone (FLONASE) 50 MCG/ACT nasal spray Place into the nose.  Marland Kitchen. omeprazole (PRILOSEC) 20 MG capsule Take 20 mg by mouth daily.     PHQ 2/9 Scores 08/31/2018 02/25/2017  PHQ - 2 Score 0 0  PHQ- 9 Score 0 -    BP Readings from Last 3 Encounters:  08/31/18 112/68  01/27/18  120/80  02/25/17 118/78    Physical Exam Vitals signs and nursing note reviewed.  Constitutional:      Appearance: Normal appearance. He is well-developed.  HENT:     Head: Normocephalic.     Right Ear: Tympanic membrane, ear canal and external ear normal.     Left Ear: Tympanic membrane, ear canal and external ear normal.      Nose: Nose normal.  Eyes:     Conjunctiva/sclera: Conjunctivae normal.     Pupils: Pupils are equal, round, and reactive to light.  Neck:     Musculoskeletal: Normal range of motion and neck supple.     Thyroid: No thyromegaly.     Vascular: No carotid bruit.  Cardiovascular:     Rate and Rhythm: Normal rate and regular rhythm.     Heart sounds: Normal heart sounds.  Pulmonary:     Effort: Pulmonary effort is normal.     Breath sounds: Normal breath sounds. No wheezing.  Chest:     Breasts:        Right: No mass.        Left: No mass.  Abdominal:     General: Abdomen is flat. Bowel sounds are normal.     Palpations: Abdomen is soft.     Tenderness: There is no abdominal tenderness.  Musculoskeletal: Normal range of motion.  Lymphadenopathy:     Cervical: No cervical adenopathy.  Skin:    General: Skin is warm and dry.     Findings: No lesion or rash.     Comments: Redness from sun exposure on abdomen  Neurological:     Mental Status: He is alert and oriented to person, place, and time.     Cranial Nerves: Cranial nerves are intact.     Sensory: Sensation is intact.     Motor: Motor function is intact.     Coordination: Coordination is intact.     Gait: Gait is intact.     Deep Tendon Reflexes: Reflexes are normal and symmetric.     Reflex Scores:      Bicep reflexes are 2+ on the right side and 2+ on the left side.      Patellar reflexes are 2+ on the right side and 2+ on the left side. Psychiatric:        Attention and Perception: Attention normal.        Mood and Affect: Mood normal.        Speech: Speech normal.        Behavior: Behavior normal.        Thought Content: Thought content normal.        Cognition and Memory: Cognition normal.        Judgment: Judgment normal.     Wt Readings from Last 3 Encounters:  08/31/18 175 lb (79.4 kg)  01/27/18 181 lb (82.1 kg)  02/25/17 179 lb (81.2 kg)    BP 112/68   Pulse 75   Ht 5\' 10"  (1.778 m)   Wt 175 lb (79.4  kg)   SpO2 98%   BMI 25.11 kg/m   Assessment and Plan: 1. Annual physical exam Normal exam Continue healthy diet, regular exercise - Comprehensive metabolic panel - Lipid panel - POCT urinalysis dipstick  2. Eosinophilic esophagitis Sx controlled on PPI - CBC with Differential/Platelet  3. Arnold-Chiari malformation, type I ([place]) MRI shows no evidence of this  4. Chronic ethmoidal sinusitis Continue Flonase nasal spray  5. Colon cancer screening Father has Crohn's  disease and hx of multiple polyps - Fecal occult blood, imunochemical   Partially dictated using Animal nutritionistDragon software. Any errors are unintentional.  Bari EdwardLaura Lindel Marcell, MD [facility]  08/31/2018

## 2018-09-01 LAB — CBC WITH DIFFERENTIAL/PLATELET
Basophils Absolute: 0 10*3/uL (ref 0.0–0.2)
Basos: 1 %
EOS (ABSOLUTE): 0.2 10*3/uL (ref 0.0–0.4)
Eos: 4 %
Hematocrit: 46.2 % (ref 37.5–51.0)
Hemoglobin: 15.5 g/dL (ref 13.0–17.7)
Immature Grans (Abs): 0 10*3/uL (ref 0.0–0.1)
Immature Granulocytes: 0 %
Lymphocytes Absolute: 1.6 10*3/uL (ref 0.7–3.1)
Lymphs: 37 %
MCH: 34.1 pg — ABNORMAL HIGH (ref 26.6–33.0)
MCHC: 33.5 g/dL (ref 31.5–35.7)
MCV: 102 fL — ABNORMAL HIGH (ref 79–97)
Monocytes Absolute: 0.4 10*3/uL (ref 0.1–0.9)
Monocytes: 9 %
Neutrophils Absolute: 2.2 10*3/uL (ref 1.4–7.0)
Neutrophils: 49 %
Platelets: 266 10*3/uL (ref 150–450)
RBC: 4.55 x10E6/uL (ref 4.14–5.80)
RDW: 12.4 % (ref 11.6–15.4)
WBC: 4.5 10*3/uL (ref 3.4–10.8)

## 2018-09-01 LAB — COMPREHENSIVE METABOLIC PANEL
ALT: 15 IU/L (ref 0–44)
AST: 16 IU/L (ref 0–40)
Albumin/Globulin Ratio: 1.8 (ref 1.2–2.2)
Albumin: 4.7 g/dL (ref 4.0–5.0)
Alkaline Phosphatase: 85 IU/L (ref 39–117)
BUN/Creatinine Ratio: 14 (ref 9–20)
BUN: 14 mg/dL (ref 6–20)
Bilirubin Total: 1.6 mg/dL — ABNORMAL HIGH (ref 0.0–1.2)
CO2: 26 mmol/L (ref 20–29)
Calcium: 9.9 mg/dL (ref 8.7–10.2)
Chloride: 99 mmol/L (ref 96–106)
Creatinine, Ser: 1.01 mg/dL (ref 0.76–1.27)
GFR calc Af Amer: 110 mL/min/{1.73_m2} (ref >59)
GFR calc non Af Amer: 95 mL/min/{1.73_m2} (ref >59)
Globulin, Total: 2.6 g/dL (ref 1.5–4.5)
Glucose: 99 mg/dL (ref 65–99)
Potassium: 4.5 mmol/L (ref 3.5–5.2)
Sodium: 137 mmol/L (ref 134–144)
Total Protein: 7.3 g/dL (ref 6.0–8.5)

## 2018-09-01 LAB — LIPID PANEL
Chol/HDL Ratio: 3.5 ratio (ref 0.0–5.0)
Cholesterol, Total: 166 mg/dL (ref 100–199)
HDL: 47 mg/dL (ref >39)
LDL Calculated: 108 mg/dL — ABNORMAL HIGH (ref 0–99)
Triglycerides: 54 mg/dL (ref 0–149)
VLDL Cholesterol Cal: 11 mg/dL (ref 5–40)

## 2018-09-02 DIAGNOSIS — Z1211 Encounter for screening for malignant neoplasm of colon: Secondary | ICD-10-CM | POA: Diagnosis not present

## 2018-09-16 LAB — FECAL OCCULT BLOOD, IMMUNOCHEMICAL: Fecal Occult Bld: NEGATIVE

## 2018-12-28 DIAGNOSIS — R1013 Epigastric pain: Secondary | ICD-10-CM | POA: Diagnosis not present

## 2018-12-28 DIAGNOSIS — R1314 Dysphagia, pharyngoesophageal phase: Secondary | ICD-10-CM | POA: Diagnosis not present

## 2019-01-09 IMAGING — MR MR HEAD WO/W CM
11 series · 43 of 48 positions shown · IV contrast (gadavist)
Comparison: None.

CLINICAL DATA: Chiari malformation.  Other headache syndrome

EXAM:
MRI HEAD WITHOUT AND WITH CONTRAST
TECHNIQUE: Multiplanar, multiecho pulse sequences of the brain and surrounding
structures were obtained without and with intravenous contrast.
CONTRAST:  8.3 cc Gadavist intravenous

[Series 5: ax dwi_tracew · axial · 3.0mm · 0.60mm/px · z∈[-55,+107]mm · 5 of 55 slices shown]
[im 1/55]
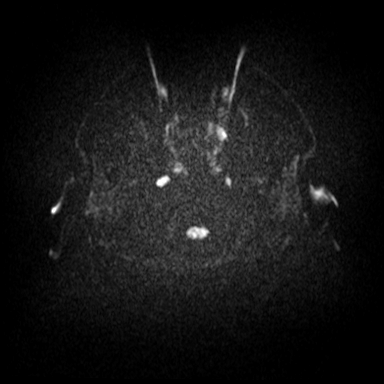
[im 14/55]
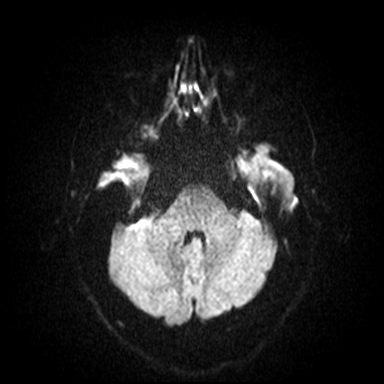
[im 28/55]
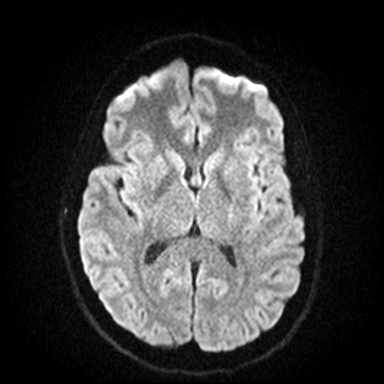
[im 41/55]
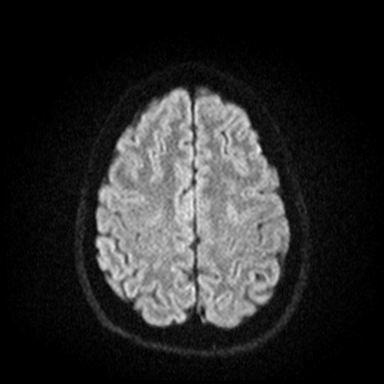
[im 55/55]
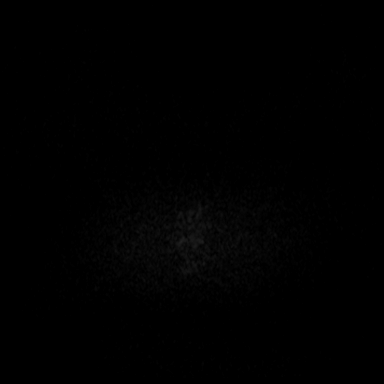

[Series 6: ax dwi_adc · axial · 3.0mm · 0.60mm/px · z∈[-55,+107]mm · 4 of 55 slices shown]
[im 1/55]
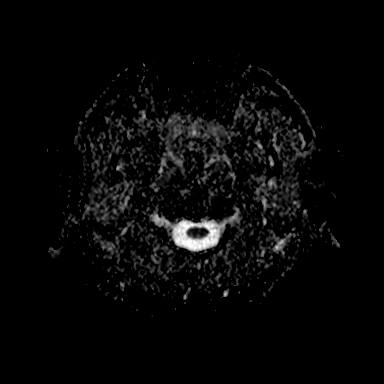
[im 19/55]
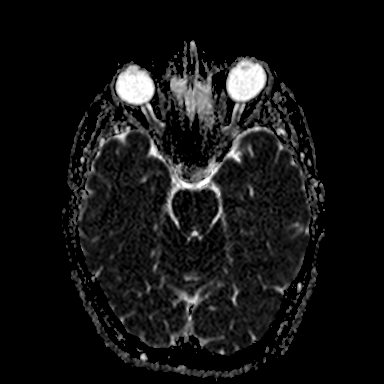
[im 37/55]
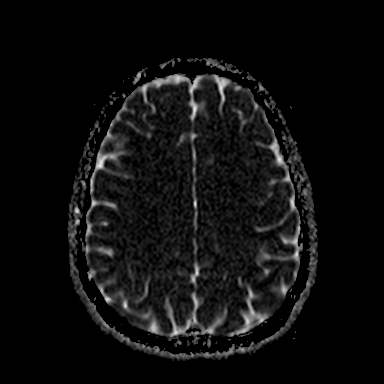
[im 55/55]
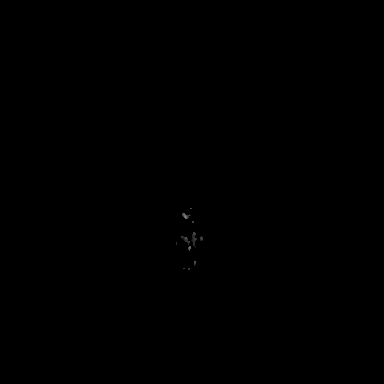

[Series 7: cor dwi_tracew · coronal · 5.0mm · 0.60mm/px · 3 of 43 slices shown]
[im 1/43]
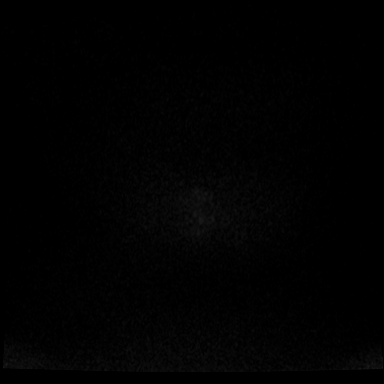
[im 22/43]
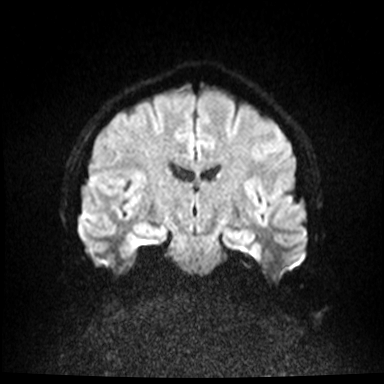
[im 43/43]
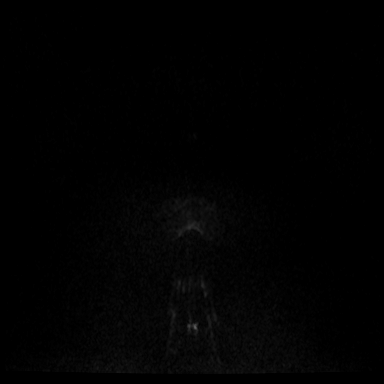

[Series 8: cor dwi_adc · coronal · 5.0mm · 0.60mm/px · 3 of 43 slices shown]
[im 1/43]
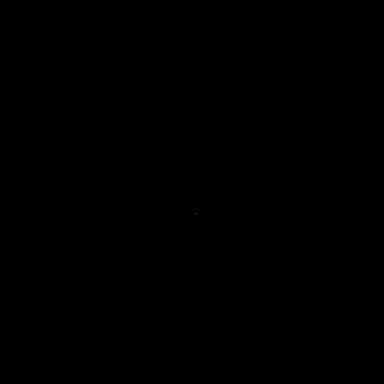
[im 22/43]
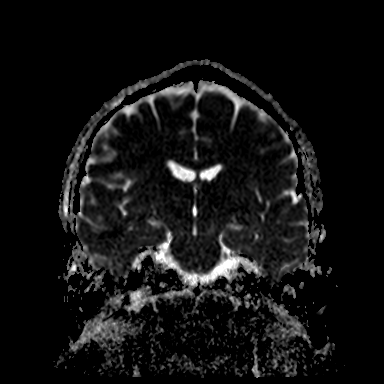
[im 43/43]
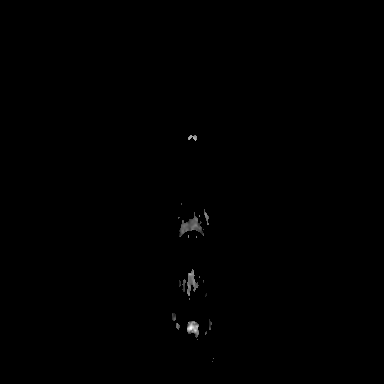

[Series 9: T1 · sagittal · 5.0mm · 0.62mm/px · 2 of 23 slices shown (1 of 2)]
[im 1/23]
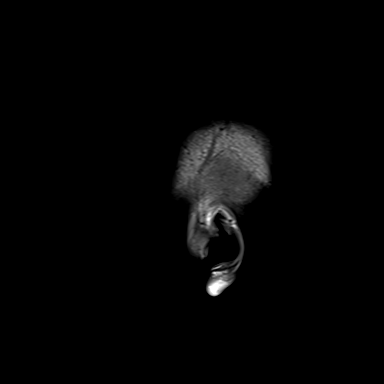
[im 23/23]
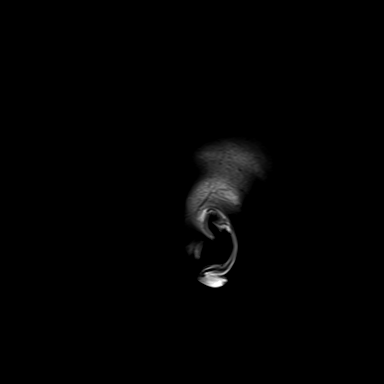

[Series 10: T2 · axial · 5.0mm · 0.53mm/px · z∈[-61,+107]mm · 2 of 29 slices shown]
[im 1/29]
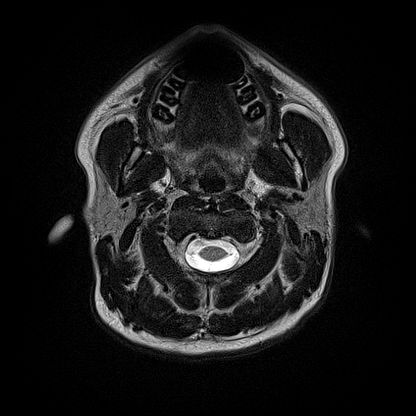
[im 29/29]
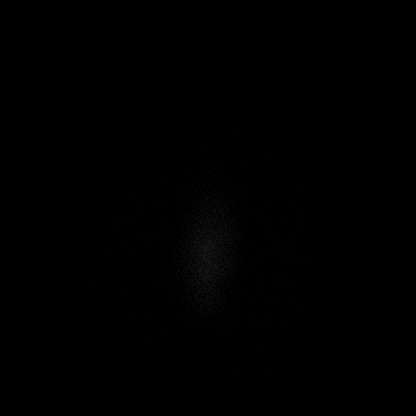

[Series 11: swi_images · axial · 3.0mm · 0.90mm/px · z∈[-65,+112]mm · 5 of 60 slices shown]
[im 1/60]
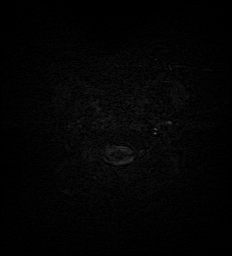
[im 15/60]
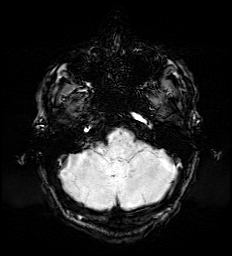
[im 30/60]
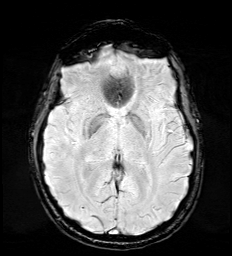
[im 45/60]
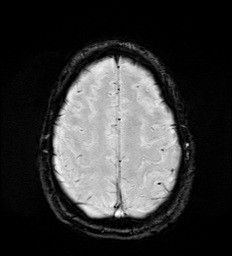
[im 60/60]
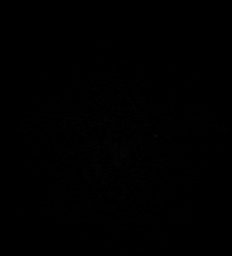

[Series 12: mip_images(sw) · axial · 24.0mm · 0.90mm/px · z∈[-55,+101]mm · 4 of 53 slices shown]
[im 1/53]
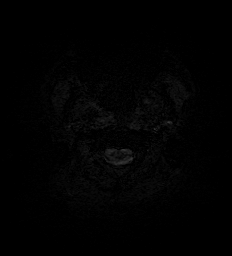
[im 18/53]
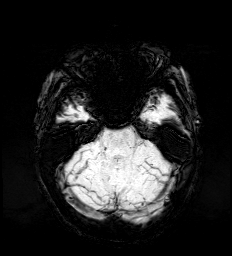
[im 35/53]
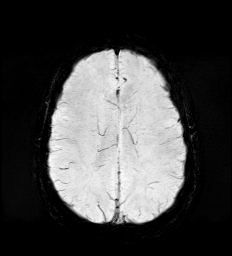
[im 53/53]
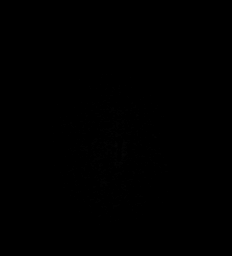

[Series 13: FLAIR · axial · 3.0mm · 0.53mm/px · z∈[-58,+104]mm · 4 of 55 slices shown]
[im 1/55]
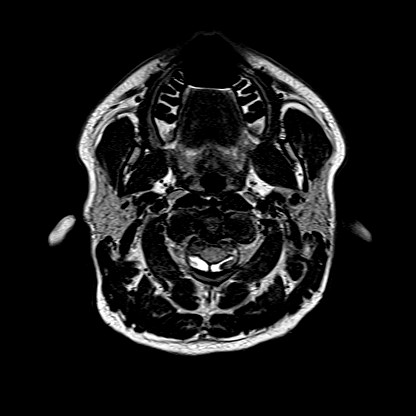
[im 19/55]
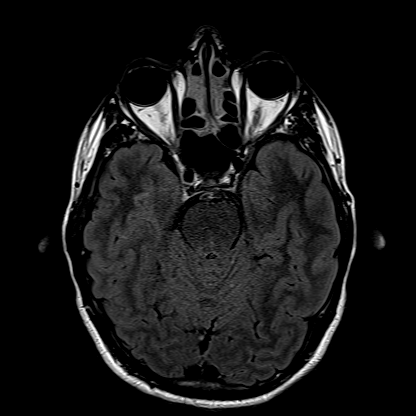
[im 37/55]
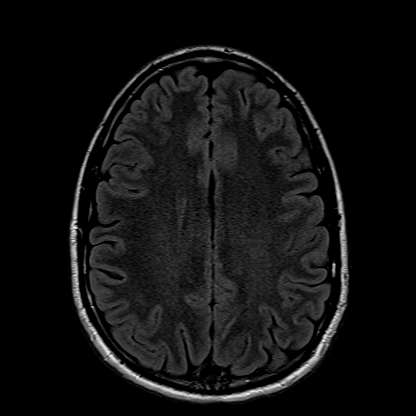
[im 55/55]
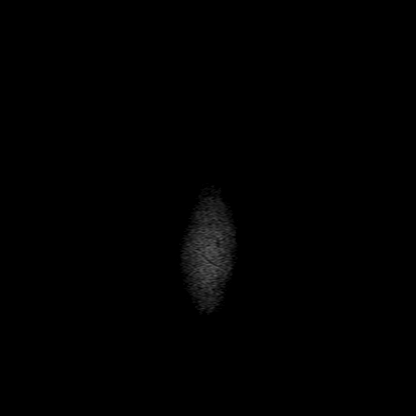

[Series 14: T1 · axial · 1.0mm · 0.98mm/px · z∈[-72,+116]mm · 9 of 186 slices shown (2 of 2)]
[im 1/186]
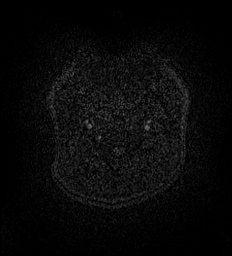
[im 15/186]
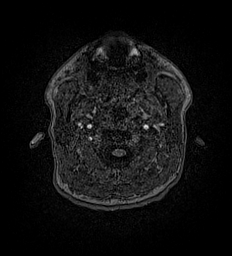
[im 29/186]
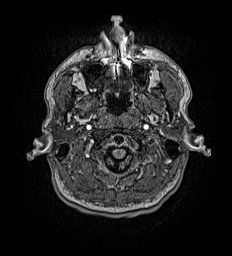
[im 57/186]
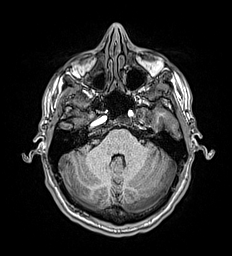
[im 86/186]
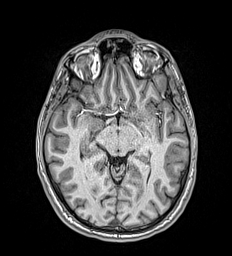
[im 100/186]
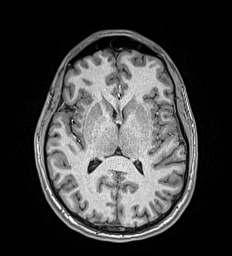
[im 129/186]
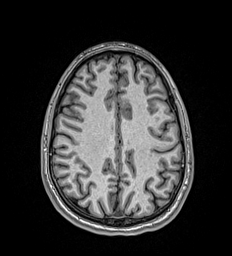
[im 157/186]
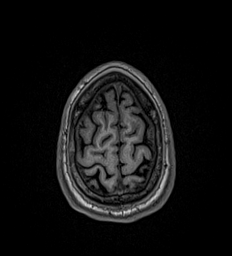
[im 186/186]
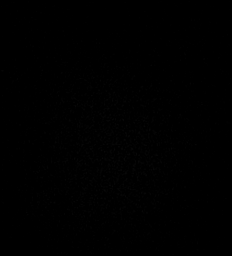

[Series 15: T2 post-contrast · coronal · 5.0mm · 0.57mm/px · 2 of 33 slices shown]
[im 1/33]
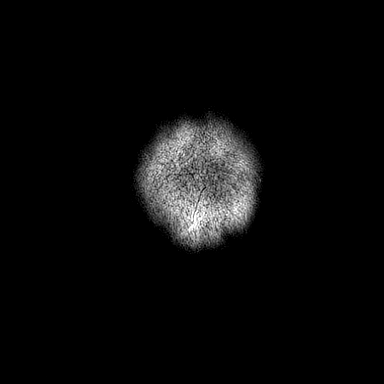
[im 33/33]
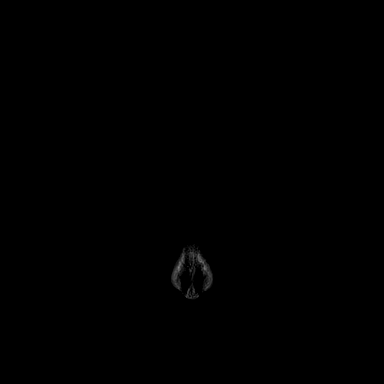

[43 of 48 positions shown; findings below may reference images not displayed]

FINDINGS: Brain: No infarction, hemorrhage, hydrocephalus, extra-axial
collection or mass lesion. No white matter disease or atrophy. No
foramen magnum stenosis or cerebellar tonsillar pointing to suggest
Chiari malformation.

Vascular: Major flow voids and vascular enhancements are preserved

Skull and upper cervical spine: Negative for marrow lesion

Sinuses/Orbits: Generalized mucosal thickening greatest in the
ethmoids and inferior frontal sinuses.

Other: Reportedly the patient developed sneezing and hives after
contrast injection and was evaluated on site. This allergy has been
added to the EMR by on-site staff.
IMPRESSION: 1. Normal appearance of the brain.
2. Generalized sinusitis that is most extensive in the ethmoids.

## 2019-02-19 ENCOUNTER — Other Ambulatory Visit
Admission: RE | Admit: 2019-02-19 | Discharge: 2019-02-19 | Disposition: A | Discharge status: Home / Self Care | Payer: 59 | Source: Ambulatory Visit | Attending: Internal Medicine | Admitting: Internal Medicine

## 2019-02-19 ENCOUNTER — Other Ambulatory Visit: Payer: Self-pay

## 2019-02-19 DIAGNOSIS — Z20828 Contact with and (suspected) exposure to other viral communicable diseases: Secondary | ICD-10-CM | POA: Diagnosis not present

## 2019-02-19 DIAGNOSIS — Z01812 Encounter for preprocedural laboratory examination: Secondary | ICD-10-CM | POA: Insufficient documentation

## 2019-02-20 LAB — SARS CORONAVIRUS 2 (TAT 6-24 HRS): SARS Coronavirus 2: NEGATIVE

## 2019-02-21 ENCOUNTER — Encounter: Payer: Self-pay | Admitting: Internal Medicine

## 2019-02-22 ENCOUNTER — Ambulatory Visit: Payer: 59 | Admitting: Anesthesiology

## 2019-02-22 ENCOUNTER — Encounter: Payer: Self-pay | Admitting: Internal Medicine

## 2019-02-22 ENCOUNTER — Ambulatory Visit
Admission: RE | Admit: 2019-02-22 | Discharge: 2019-02-22 | Disposition: A | Discharge status: Home / Self Care | Payer: 59 | Attending: Internal Medicine | Admitting: Internal Medicine

## 2019-02-22 ENCOUNTER — Encounter
Admission: RE | Disposition: A | Discharge status: Home / Self Care | Payer: Self-pay | Source: Home / Self Care | Attending: Internal Medicine

## 2019-02-22 ENCOUNTER — Other Ambulatory Visit: Payer: Self-pay

## 2019-02-22 DIAGNOSIS — Z87891 Personal history of nicotine dependence: Secondary | ICD-10-CM | POA: Insufficient documentation

## 2019-02-22 DIAGNOSIS — J329 Chronic sinusitis, unspecified: Secondary | ICD-10-CM | POA: Diagnosis not present

## 2019-02-22 DIAGNOSIS — K222 Esophageal obstruction: Secondary | ICD-10-CM | POA: Insufficient documentation

## 2019-02-22 DIAGNOSIS — R1314 Dysphagia, pharyngoesophageal phase: Secondary | ICD-10-CM | POA: Diagnosis not present

## 2019-02-22 DIAGNOSIS — K449 Diaphragmatic hernia without obstruction or gangrene: Secondary | ICD-10-CM | POA: Diagnosis not present

## 2019-02-22 DIAGNOSIS — R1310 Dysphagia, unspecified: Secondary | ICD-10-CM | POA: Diagnosis not present

## 2019-02-22 DIAGNOSIS — Z7689 Persons encountering health services in other specified circumstances: Secondary | ICD-10-CM | POA: Diagnosis not present

## 2019-02-22 DIAGNOSIS — Z79899 Other long term (current) drug therapy: Secondary | ICD-10-CM | POA: Diagnosis not present

## 2019-02-22 DIAGNOSIS — K2970 Gastritis, unspecified, without bleeding: Secondary | ICD-10-CM | POA: Diagnosis not present

## 2019-02-22 DIAGNOSIS — K219 Gastro-esophageal reflux disease without esophagitis: Secondary | ICD-10-CM | POA: Insufficient documentation

## 2019-02-22 HISTORY — DX: Eosinophilic esophagitis: K20.0

## 2019-02-22 HISTORY — DX: Cholecystitis, unspecified: K81.9

## 2019-02-22 HISTORY — DX: Renal tubulo-interstitial disease, unspecified: N15.9

## 2019-02-22 HISTORY — PX: ESOPHAGOGASTRODUODENOSCOPY (EGD) WITH PROPOFOL: SHX5813

## 2019-02-22 SURGERY — ESOPHAGOGASTRODUODENOSCOPY (EGD) WITH PROPOFOL
Anesthesia: General

## 2019-02-22 MED ORDER — PROPOFOL 500 MG/50ML IV EMUL
INTRAVENOUS | Status: AC
  Filled 2019-02-22: qty 50

## 2019-02-22 MED ORDER — PROPOFOL 10 MG/ML IV BOLUS
INTRAVENOUS | Status: AC
  Filled 2019-02-22: qty 20

## 2019-02-22 MED ORDER — PROPOFOL 500 MG/50ML IV EMUL
INTRAVENOUS | Status: DC | PRN
Start: 2019-02-22 — End: 2019-02-22
  Administered 2019-02-22: 175 ug/kg/min via INTRAVENOUS

## 2019-02-22 MED ORDER — SODIUM CHLORIDE 0.9 % IV SOLN: INTRAVENOUS | Status: DC | PRN

## 2019-02-22 MED ORDER — LIDOCAINE HCL (CARDIAC) PF 100 MG/5ML IV SOSY
PREFILLED_SYRINGE | INTRAVENOUS | Status: DC | PRN
  Administered 2019-02-22: 60 mg via INTRAVENOUS

## 2019-02-22 MED ORDER — PROPOFOL 10 MG/ML IV BOLUS
INTRAVENOUS | Status: DC | PRN
  Administered 2019-02-22 (×2): 40 mg via INTRAVENOUS

## 2019-02-22 MED ORDER — SODIUM CHLORIDE 0.9 % IV SOLN: INTRAVENOUS | Status: DC

## 2019-02-22 NOTE — Interval H&P Note (Signed)
History and Physical Interval Note:  02/22/2019 10:32 AM  Charles Simmons  has presented today for surgery, with the diagnosis of DYSPHAGIA.  The various methods of treatment have been discussed with the patient and family. After consideration of risks, benefits and other options for treatment, the patient has consented to  Procedure(s): ESOPHAGOGASTRODUODENOSCOPY (EGD) WITH PROPOFOL (N/A) as a surgical intervention.  The patient's history has been reviewed, patient examined, no change in status, stable for surgery.  I have reviewed the patient's chart and labs.  Questions were answered to the patient's satisfaction.     [place], [place]

## 2019-02-22 NOTE — Anesthesia Preprocedure Evaluation (Signed)
Anesthesia Evaluation  Patient identified by MRN, date of birth, ID band Patient awake    Reviewed: Allergy & Precautions, NPO status , Patient's Chart, lab work & pertinent test results, reviewed documented beta blocker date and time   Airway Mallampati: II  TM Distance: >3 FB     Dental  (+) Chipped   Pulmonary former smoker,           Cardiovascular      Neuro/Psych  Headaches,    GI/Hepatic GERD  ,  Endo/Other    Renal/GU Renal disease     Musculoskeletal   Abdominal   Peds  Hematology   Anesthesia Other Findings   Reproductive/Obstetrics                             Anesthesia Physical Anesthesia Plan  ASA: II  Anesthesia Plan: General   Post-op Pain Management:    Induction: Intravenous  PONV Risk Score and Plan:   Airway Management Planned:   Additional Equipment:   Intra-op Plan:   Post-operative Plan:   Informed Consent: I have reviewed the patients History and Physical, chart, labs and discussed the procedure including the risks, benefits and alternatives for the proposed anesthesia with the patient or authorized representative who has indicated his/her understanding and acceptance.       Plan Discussed with: CRNA  Anesthesia Plan Comments:         Anesthesia Quick Evaluation

## 2019-02-22 NOTE — Anesthesia Postprocedure Evaluation (Signed)
Anesthesia Post Note  Patient: Charles Simmons  Procedure(s) Performed: ESOPHAGOGASTRODUODENOSCOPY (EGD) WITH PROPOFOL (N/A )  Patient location during evaluation: PACU Anesthesia Type: General Level of consciousness: awake and alert Pain management: pain level controlled Vital Signs Assessment: post-procedure vital signs reviewed and stable Respiratory status: spontaneous breathing, nonlabored ventilation, respiratory function stable and patient connected to nasal cannula oxygen Cardiovascular status: blood pressure returned to baseline and stable Postop Assessment: no apparent nausea or vomiting Anesthetic complications: no     Last Vitals:  Vitals:   02/22/19 1053 02/22/19 1103  BP: (!) 134/97 (!) 134/99  Pulse:    Resp:    Temp:    SpO2:      Last Pain:  Vitals:   02/22/19 1123  TempSrc:   PainSc: 0-No pain                 Kayonna Lawniczak S

## 2019-02-22 NOTE — Op Note (Signed)
[facility] Gastroenterology Patient Name: Charles Simmons Procedure Date: 02/22/2019 10:16 AM MRN: [id number] Account #: [id number] Date of Birth: 1980/03/31 Admit Type: Outpatient Age: 38 Room: [facility] ENDO ROOM 1 Gender: Male Note Status: Finalized Procedure:             Upper GI endoscopy Indications:           Esophageal dysphagia, Follow-up of esophageal reflux,                         Failure to respond to medical treatment Providers:             Boykin Nearing. Norma Fredrickson MD, MD Referring MD:          Bari Edward, MD (Referring MD) Medicines:             Propofol per Anesthesia Complications:         No immediate complications. Procedure:             Pre-Anesthesia Assessment:                        - The risks and benefits of the procedure and the                         sedation options and risks were discussed with the                         patient. All questions were answered and informed                         consent was obtained.                        - Patient identification and proposed procedure were                         verified prior to the procedure by the nurse. The                         procedure was verified in the procedure room.                        - ASA Grade Assessment: I - A normal, healthy patient.                        - After reviewing the risks and benefits, the patient                         was deemed in satisfactory condition to undergo the                         procedure.                        After obtaining informed consent, the endoscope was                         passed under direct vision. Throughout the procedure,  the patient's blood pressure, pulse, and oxygen                         saturations were monitored continuously. The Endoscope                         was introduced through the mouth, and advanced to the                         third part of duodenum. The upper GI endoscopy was                          accomplished without difficulty. The patient tolerated                         the procedure well. Findings:      Normal mucosa was found in the upper third of the esophagus, in the       middle third of the esophagus and in the distal esophagus. Biopsies were       obtained from the proximal and distal esophagus with cold forceps for       histology of suspected eosinophilic esophagitis.      A non-obstructing Schatzki ring was found at the gastroesophageal       junction. The scope was withdrawn. Dilation was performed with a Maloney       dilator with no resistance at 54 Fr.      Localized mild inflammation characterized by erythema was found in the       gastric antrum.      A 1 cm hiatal hernia was present.      The examined duodenum was normal.      The exam was otherwise without abnormality. Impression:            - Normal mucosa was found in the upper third of the                         esophagus, in the middle third of the esophagus and in                         the distal esophagus. Biopsied.                        - Non-obstructing Schatzki ring. Dilated.                        - Gastritis.                        - 1 cm hiatal hernia.                        - Normal examined duodenum.                        - The examination was otherwise normal. Recommendation:        - Patient has a contact number available for                         emergencies. The signs and symptoms of potential  delayed complications were discussed with the patient.                         Return to normal activities tomorrow. Written                         discharge instructions were provided to the patient.                        - Resume previous diet.                        - Continue present medications.                        - Use Pepcid (famotidine) 20 mg PO at bedtime.                        - Return to nurse practitioner in 3 months.                         - Follow up with Vevelyn Pathristiane London, GI Nurse                         Practioner, in office to discuss results and monitor                         progress. Procedure Code(s):     --- Professional ---                        [id number], Esophagogastroduodenoscopy, flexible,                         transoral; with biopsy, single or multiple                        [id number], Dilation of esophagus, by unguided sound or                         bougie, single or multiple passes Diagnosis Code(s):     --- Professional ---                        K21.9, Gastro-esophageal reflux disease without                         esophagitis                        K44.9, Diaphragmatic hernia without obstruction or                         gangrene                        R13.14, Dysphagia, pharyngoesophageal phase                        K29.70, Gastritis, unspecified, without bleeding                        K22.2, Esophageal obstruction CPT copyright 2019 American  Medical Association. All rights reserved. The codes documented in this report are preliminary and upon coder review may  be revised to meet current compliance requirements. Efrain Sella MD, MD 02/22/2019 10:44:44 AM This report has been signed electronically. Number of Addenda: 0 Note Initiated On: 02/22/2019 10:16 AM Estimated Blood Loss:  Estimated blood loss: none.      [facility]

## 2019-02-22 NOTE — Anesthesia Post-op Follow-up Note (Signed)
Anesthesia QCDR form completed.        

## 2019-02-22 NOTE — H&P (Signed)
  Outpatient short stay form Pre-procedure 02/22/2019 10:30 AM Nikira Kushnir K. Alice Reichert, M.D.  Primary Physician: Halina Maidens, M.D.  Reason for visit:  GERD, Dysphagia.  History of present illness:  38 y/o male presents for dysphagia to solids and has responded in the past with esophageal dilation. Has breakthrough nighttime GERD symptoms despite recent increase of omeprazole from 20mg  daily to 40mg  q AM. No abdominal pain or melena.     Current Facility-Administered Medications:  .  0.9 %  sodium chloride infusion, , Intravenous, Continuous, [place], Benay Pike, MD, Last Rate: 20 mL/hr at 02/22/19 0955, New Bag at 02/22/19 3762  Facility-Administered Medications Ordered in Other Encounters:  .  propofol (DIPRIVAN) 500 MG/50ML infusion, , Intravenous, Continuous PRN, Gayland Curry, CRNA, Last Rate: 80.955 mL/hr at 02/22/19 1030, 175 mcg/kg/min at 02/22/19 1030  Medications Prior to Admission  Medication Sig Dispense Refill Last Dose  . cetirizine (ZYRTEC) 10 MG tablet Take by mouth.   Past Month at Unknown time  . fluticasone (FLONASE) 50 MCG/ACT nasal spray Place into the nose.   02/21/2019 at 0800  . omeprazole (PRILOSEC) 20 MG capsule Take 40 mg by mouth daily.   3 02/21/2019 at 0800  . Omeprazole-Sodium Bicarbonate (ZEGERID) 20-1100 MG CAPS capsule Take 1 capsule by mouth daily before breakfast.   02/21/2019 at 0800     Allergies  Allergen Reactions  . Gadolinium Derivatives Hives  . Ivp Dye [Iodinated Diagnostic Agents]      Past Medical History:  Diagnosis Date  . Arnold-Chiari malformation, type I ([place]) 06/16/2011   Dx'd 2012 - had some headaches but improved MRI 2019 - Normal, ethmoid sinusitis  . Cholecystitis   . Eosinophilic esophagitis   . GERD (gastroesophageal reflux disease)   . Renal infection     Review of systems:  Otherwise negative.    Physical Exam  Gen: Alert, oriented. Appears stated age.  HEENT: [place]/AT. PERRLA. Lungs: CTA, no wheezes. CV: RR nl  S1, S2. Abd: soft, benign, no masses. BS+ Ext: No edema. Pulses 2+    Planned procedures: Proceed with EGD with possible biopsy and/or esophageal dilation. The patient understands the nature of the planned procedure, indications, risks, alternatives and potential complications including but not limited to bleeding, infection, perforation, damage to internal organs and possible oversedation/side effects from anesthesia. The patient agrees and gives consent to proceed.  Please refer to procedure notes for findings, recommendations and patient disposition/instructions.     Hardeep Reetz K. Alice Reichert, M.D. Gastroenterology 02/22/2019  10:30 AM

## 2019-02-22 NOTE — Transfer of Care (Signed)
Immediate Anesthesia Transfer of Care Note  Patient: Charles Simmons  Procedure(s) Performed: ESOPHAGOGASTRODUODENOSCOPY (EGD) WITH PROPOFOL (N/A )  Patient Location: PACU and Endoscopy Unit  Anesthesia Type:General  Level of Consciousness: awake and patient cooperative  Airway & Oxygen Therapy: Patient Spontanous Breathing  Post-op Assessment: Report given to RN and Post -op Vital signs reviewed and stable  Post vital signs: Reviewed and stable  Last Vitals:  Vitals Value Taken Time  BP 126/85 02/22/19 1043  Temp 36.4 C 02/22/19 1043  Pulse 71 02/22/19 1044  Resp 15 02/22/19 1044  SpO2 98 % 02/22/19 1044  Vitals shown include unvalidated device data.  Last Pain:  Vitals:   02/22/19 1043  TempSrc: Temporal  PainSc: Asleep         Complications: No apparent anesthesia complications

## 2019-02-23 ENCOUNTER — Encounter: Payer: Self-pay | Admitting: *Deleted

## 2019-02-26 LAB — SURGICAL PATHOLOGY

## 2019-12-18 ENCOUNTER — Other Ambulatory Visit: Payer: Self-pay | Admitting: Gastroenterology

## 2019-12-18 DIAGNOSIS — R1013 Epigastric pain: Secondary | ICD-10-CM | POA: Diagnosis not present

## 2019-12-18 DIAGNOSIS — R1319 Other dysphagia: Secondary | ICD-10-CM | POA: Diagnosis not present

## 2020-01-11 ENCOUNTER — Other Ambulatory Visit: Payer: Self-pay

## 2020-01-11 ENCOUNTER — Other Ambulatory Visit
Admission: RE | Admit: 2020-01-11 | Discharge: 2020-01-11 | Disposition: A | Discharge status: Home / Self Care | Payer: 59 | Source: Ambulatory Visit | Attending: Gastroenterology | Admitting: Gastroenterology

## 2020-01-11 DIAGNOSIS — Z20822 Contact with and (suspected) exposure to covid-19: Secondary | ICD-10-CM | POA: Insufficient documentation

## 2020-01-11 DIAGNOSIS — Z01812 Encounter for preprocedural laboratory examination: Secondary | ICD-10-CM | POA: Insufficient documentation

## 2020-01-11 LAB — SARS CORONAVIRUS 2 (TAT 6-24 HRS): SARS Coronavirus 2: NEGATIVE

## 2020-01-14 ENCOUNTER — Encounter: Payer: Self-pay | Admitting: *Deleted

## 2020-01-15 ENCOUNTER — Ambulatory Visit: Payer: 59 | Admitting: Certified Registered Nurse Anesthetist

## 2020-01-15 ENCOUNTER — Other Ambulatory Visit: Payer: Self-pay

## 2020-01-15 ENCOUNTER — Ambulatory Visit
Admission: RE | Admit: 2020-01-15 | Discharge: 2020-01-15 | Disposition: A | Discharge status: Home / Self Care | Payer: 59 | Attending: Gastroenterology | Admitting: Gastroenterology

## 2020-01-15 ENCOUNTER — Encounter: Payer: Self-pay | Admitting: *Deleted

## 2020-01-15 ENCOUNTER — Encounter
Admission: RE | Disposition: A | Discharge status: Home / Self Care | Payer: Self-pay | Source: Home / Self Care | Attending: Gastroenterology

## 2020-01-15 DIAGNOSIS — R1310 Dysphagia, unspecified: Secondary | ICD-10-CM | POA: Diagnosis not present

## 2020-01-15 DIAGNOSIS — R1013 Epigastric pain: Secondary | ICD-10-CM | POA: Diagnosis not present

## 2020-01-15 DIAGNOSIS — Z87891 Personal history of nicotine dependence: Secondary | ICD-10-CM | POA: Diagnosis not present

## 2020-01-15 DIAGNOSIS — R1319 Other dysphagia: Secondary | ICD-10-CM | POA: Diagnosis not present

## 2020-01-15 HISTORY — PX: ESOPHAGOGASTRODUODENOSCOPY (EGD) WITH PROPOFOL: SHX5813

## 2020-01-15 SURGERY — ESOPHAGOGASTRODUODENOSCOPY (EGD) WITH PROPOFOL
Anesthesia: General

## 2020-01-15 MED ORDER — LIDOCAINE HCL (CARDIAC) PF 100 MG/5ML IV SOSY
PREFILLED_SYRINGE | INTRAVENOUS | Status: DC | PRN
  Administered 2020-01-15: 100 mg via INTRAVENOUS

## 2020-01-15 MED ORDER — PROPOFOL 10 MG/ML IV BOLUS
INTRAVENOUS | Status: DC | PRN
  Administered 2020-01-15 (×2): 50 mg via INTRAVENOUS

## 2020-01-15 MED ORDER — GLYCOPYRROLATE 0.2 MG/ML IJ SOLN
INTRAMUSCULAR | Status: DC | PRN
  Administered 2020-01-15: .2 mg via INTRAVENOUS

## 2020-01-15 MED ORDER — SODIUM CHLORIDE 0.9 % IV SOLN: INTRAVENOUS | Status: DC

## 2020-01-15 MED ORDER — PROPOFOL 500 MG/50ML IV EMUL
INTRAVENOUS | Status: DC | PRN
  Administered 2020-01-15: 140 ug/kg/min via INTRAVENOUS

## 2020-01-15 MED ORDER — PROPOFOL 10 MG/ML IV BOLUS
INTRAVENOUS | Status: AC
  Filled 2020-01-15: qty 20

## 2020-01-15 MED ORDER — FENTANYL CITRATE (PF) 100 MCG/2ML IJ SOLN
INTRAMUSCULAR | Status: DC | PRN
  Administered 2020-01-15: 50 ug via INTRAVENOUS

## 2020-01-15 MED ORDER — FENTANYL CITRATE (PF) 100 MCG/2ML IJ SOLN
INTRAMUSCULAR | Status: AC
  Filled 2020-01-15: qty 2

## 2020-01-15 NOTE — Op Note (Signed)
[facility] Gastroenterology Patient Name: Charles Simmons Procedure Date: 01/15/2020 12:43 PM MRN: [id number] Account #: [id number] Date of Birth: [birth year] Admit Type: Outpatient Age: 39 Room: [facility] ENDO ROOM 1 Gender: Male Note Status: Finalized Procedure:             Upper GI endoscopy Indications:           Dysphagia, Eosinophilic esophagitis Providers:             Andrey Farmer MD, MD Referring MD:          No Local Md, MD (Referring MD) Medicines:             Monitored Anesthesia Care Complications:         No immediate complications. Estimated blood loss:                         Minimal. Procedure:             Pre-Anesthesia Assessment:                        - Prior to the procedure, a History and Physical was                         performed, and patient medications and allergies were                         reviewed. The patient is competent. The risks and                         benefits of the procedure and the sedation options and                         risks were discussed with the patient. All questions                         were answered and informed consent was obtained.                         Patient identification and proposed procedure were                         verified by the physician, the nurse, the anesthetist                         and the technician in the endoscopy suite. Mental                         Status Examination: alert and oriented. Airway                         Examination: normal oropharyngeal airway and neck                         mobility. Respiratory Examination: clear to                         auscultation. CV Examination: normal. Prophylactic  Antibiotics: The patient does not require prophylactic                         antibiotics. Prior Anticoagulants: The patient has                         taken no previous anticoagulant or antiplatelet                         agents. ASA Grade  Assessment: II - A patient with mild                         systemic disease. After reviewing the risks and                         benefits, the patient was deemed in satisfactory                         condition to undergo the procedure. The anesthesia                         plan was to use monitored anesthesia care (MAC).                         Immediately prior to administration of medications,                         the patient was re-assessed for adequacy to receive                         sedatives. The heart rate, respiratory rate, oxygen                         saturations, blood pressure, adequacy of pulmonary                         ventilation, and response to care were monitored                         throughout the procedure. The physical status of the                         patient was re-assessed after the procedure.                        After obtaining informed consent, the endoscope was                         passed under direct vision. Throughout the procedure,                         the patient's blood pressure, pulse, and oxygen                         saturations were monitored continuously. The Endoscope                         was introduced through the mouth, and advanced to the  second part of duodenum. The upper GI endoscopy was                         accomplished without difficulty. The patient tolerated                         the procedure well. Findings:      No endoscopic abnormality was evident in the esophagus to explain the       patient's complaint of dysphagia. It was decided, however, to proceed       with dilation of the entire esophagus. A guidewire was placed and the       scope was withdrawn. Dilation was performed with a Savary dilator with       no resistance at 45 Fr and 51 Fr. The dilation site was examined and       showed mild mucosal disruption. Biopsies were obtained from the proximal       and distal  esophagus with cold forceps for histology of suspected       eosinophilic esophagitis. Estimated blood loss was minimal.      The entire examined stomach was normal.      The examined duodenum was normal. Impression:            - No endoscopic esophageal abnormality to explain                         patient's dysphagia. Esophagus dilated. Dilated.                         Biopsied.                        - Normal stomach.                        - Normal examined duodenum. Recommendation:        - Discharge patient to home.                        - Resume previous diet.                        - Continue present medications.                        - Await pathology results.                        - Return to referring physician as previously                         scheduled. Procedure Code(s):     --- Professional ---                        [phone], Esophagogastroduodenoscopy, flexible,                         transoral; with insertion of guide wire followed by                         passage of dilator(s) through esophagus over guide wire Diagnosis Code(s):     ---  Professional ---                        R13.10, Dysphagia, unspecified                        U13.1, Eosinophilic esophagitis CPT copyright 2019 American Medical Association. All rights reserved. The codes documented in this report are preliminary and upon coder review may  be revised to meet current compliance requirements. Andrey Farmer, MD Andrey Farmer MD, MD 01/15/2020 1:02:08 PM Number of Addenda: 0 Note Initiated On: 01/15/2020 12:43 PM Estimated Blood Loss:  Estimated blood loss was minimal.      [facility]

## 2020-01-15 NOTE — Anesthesia Preprocedure Evaluation (Signed)
Anesthesia Evaluation  Patient identified by MRN, date of birth, ID band Patient awake    Reviewed: Allergy & Precautions, NPO status , Patient's Chart, lab work & pertinent test results  History of Anesthesia Complications Negative for: history of anesthetic complications  Airway Mallampati: II  TM Distance: >3 FB Neck ROM: Full    Dental  (+) Implants   Pulmonary neg sleep apnea, neg COPD, former smoker,    breath sounds clear to auscultation- rhonchi (-) wheezing      Cardiovascular Exercise Tolerance: Good (-) hypertension(-) CAD, (-) Past MI, (-) Cardiac Stents and (-) CABG  Rhythm:Regular Rate:Normal - Systolic murmurs and - Diastolic murmurs    Neuro/Psych  Headaches, neg Seizures negative psych ROS   GI/Hepatic Neg liver ROS, GERD  ,  Endo/Other  negative endocrine ROSneg diabetes  Renal/GU negative Renal ROS     Musculoskeletal negative musculoskeletal ROS (+)   Abdominal (+) - obese,   Peds  Hematology negative hematology ROS (+)   Anesthesia Other Findings Past Medical History: 06/16/2011: Arnold-Chiari malformation, type I ([facility])     Comment:  Dx'd 2012 - had some headaches but improved MRI 2019 -               Normal, ethmoid sinusitis No date: Cholecystitis No date: Eosinophilic esophagitis No date: GERD (gastroesophageal reflux disease) No date: Renal infection   Reproductive/Obstetrics                             Anesthesia Physical Anesthesia Plan  ASA: II  Anesthesia Plan: General   Post-op Pain Management:    Induction: Intravenous  PONV Risk Score and Plan: 1 and Propofol infusion  Airway Management Planned: Natural Airway  Additional Equipment:   Intra-op Plan:   Post-operative Plan:   Informed Consent: I have reviewed the patients History and Physical, chart, labs and discussed the procedure including the risks, benefits and alternatives for the  proposed anesthesia with the patient or authorized representative who has indicated his/her understanding and acceptance.     Dental advisory given  Plan Discussed with: CRNA and Anesthesiologist  Anesthesia Plan Comments:         Anesthesia Quick Evaluation

## 2020-01-15 NOTE — Interval H&P Note (Signed)
History and Physical Interval Note:  01/15/2020 12:35 PM  Barry Brunner  has presented today for surgery, with the diagnosis of DYSPEPSIA ESOPH DYSPHAGIA.  The various methods of treatment have been discussed with the patient and family. After consideration of risks, benefits and other options for treatment, the patient has consented to  Procedure(s): ESOPHAGOGASTRODUODENOSCOPY (EGD) WITH PROPOFOL (N/A) as a surgical intervention.  The patient's history has been reviewed, patient examined, no change in status, stable for surgery.  I have reviewed the patient's chart and labs.  Questions were answered to the patient's satisfaction.     Regis Bill  Ok to proceed with EGD

## 2020-01-15 NOTE — Transfer of Care (Signed)
Immediate Anesthesia Transfer of Care Note  Patient: Charles Simmons  Procedure(s) Performed: ESOPHAGOGASTRODUODENOSCOPY (EGD) WITH PROPOFOL (N/A )  Patient Location: PACU and Endoscopy Unit  Anesthesia Type:General  Level of Consciousness: awake and alert   Airway & Oxygen Therapy: Patient Spontanous Breathing  Post-op Assessment: Report given to RN and Post -op Vital signs reviewed and stable  Post vital signs: Reviewed and stable  Last Vitals:  Vitals Value Taken Time  BP 128/80 01/15/20 1300  Temp 36.8 C 01/15/20 1300  Pulse 73 01/15/20 1300  Resp 20 01/15/20 1300  SpO2      Last Pain:  Vitals:   01/15/20 1300  TempSrc: Temporal  PainSc: 0-No pain         Complications: No complications documented.

## 2020-01-15 NOTE — Anesthesia Postprocedure Evaluation (Signed)
Anesthesia Post Note  Patient: Charles Simmons  Procedure(s) Performed: ESOPHAGOGASTRODUODENOSCOPY (EGD) WITH PROPOFOL (N/A )  Patient location during evaluation: Endoscopy Anesthesia Type: General Level of consciousness: awake and alert and oriented Pain management: pain level controlled Vital Signs Assessment: post-procedure vital signs reviewed and stable Respiratory status: spontaneous breathing, nonlabored ventilation and respiratory function stable Cardiovascular status: blood pressure returned to baseline and stable Postop Assessment: no signs of nausea or vomiting Anesthetic complications: no   No complications documented.   Last Vitals:  Vitals:   01/15/20 1310 01/15/20 1320  BP: 123/84 (!) 117/93  Pulse: 68 63  Resp: 18 20  Temp:      Last Pain:  Vitals:   01/15/20 1320  TempSrc:   PainSc: 0-No pain                 Hazelgrace Bonham

## 2020-01-15 NOTE — H&P (Signed)
Outpatient short stay form Pre-procedure 01/15/2020 12:30 PM Merlyn Lot MD, MPH  Primary Physician: Dr. Judithann Graves  Reason for visit:  Dysphagia  History of present illness:   39 y/o gentleman with history of EoE here for EGD. Had EGD last year with no eosinophils on biopsies but was empirically dilated to 54 with good effect. He takes PPI. Has family history of crohns in his father. No blood thinners. Had abnormal manometry in college.    Current Facility-Administered Medications:  .  0.9 %  sodium chloride infusion, , Intravenous, Continuous, Dazha Kempa, Rossie Muskrat, MD  Medications Prior to Admission  Medication Sig Dispense Refill Last Dose  . fluticasone (FLONASE) 50 MCG/ACT nasal spray Place into the nose.   01/14/2020 at 0800  . omeprazole (PRILOSEC) 20 MG capsule Take 40 mg by mouth 2 (two) times daily before a meal.   3 01/14/2020 at 1800  . Omeprazole-Sodium Bicarbonate (ZEGERID) 20-1100 MG CAPS capsule Take 1 capsule by mouth daily before breakfast.   01/14/2020 at 0800  . cetirizine (ZYRTEC) 10 MG tablet Take by mouth.        Allergies  Allergen Reactions  . Gadolinium Derivatives Hives  . Ivp Dye [Iodinated Diagnostic Agents]      Past Medical History:  Diagnosis Date  . Arnold-Chiari malformation, type I ([facility]) 06/16/2011   Dx'd 2012 - had some headaches but improved MRI 2019 - Normal, ethmoid sinusitis  . Cholecystitis   . Eosinophilic esophagitis   . GERD (gastroesophageal reflux disease)   . Renal infection     Review of systems:  Otherwise negative.    Physical Exam  Gen: Alert, oriented. Appears stated age.  HEENT: PERRLA. Lungs: No respiratory distress CV: RRR Abd: soft, benign, no masses. Ext: No edema.     Planned procedures: Proceed with EGD. The patient understands the nature of the planned procedure, indications, risks, alternatives and potential complications including but not limited to bleeding, infection, perforation, damage to  internal organs and possible oversedation/side effects from anesthesia. The patient agrees and gives consent to proceed.  Please refer to procedure notes for findings, recommendations and patient disposition/instructions.     Merlyn Lot MD, MPH Gastroenterology 01/15/2020  12:30 PM

## 2020-01-16 LAB — SURGICAL PATHOLOGY

## 2020-01-21 ENCOUNTER — Encounter: Payer: Self-pay | Admitting: Gastroenterology

## 2020-03-31 NOTE — Progress Notes (Signed)
[name] [address] [place] [address] Phone: [phone] Subjective:   Charles Simmons, am serving as a scribe for Dr. Antoine Primas.  This visit occurred during the SARS-CoV-2 public health emergency.  Safety protocols were in place, including screening questions prior to the visit, additional usage of staff PPE, and extensive cleaning of exam room while observing appropriate contact time as indicated for disinfecting solutions.   I'm seeing this patient by the request  of:  Reubin Milan, MD  CC: Left knee pain  [id number]  Charles Simmons is a 41 y.o. male coming in with complaint of left knee pain, medial aspect. Last seen in 2016 for calf muscle weakness. Patient is training for a 1/2 marathon that is in one month. Patient states sharp pain medial knee. with impact he feels the pain the most and after running he cannot take steps up or down without pain and it is noticeable to his wife. Pain has been going on for 5-6 weeks   Aggravating factors- running, steps  Reliving factors- rest, heat, ice  Therapies tried- motrin Severity-     Past Medical History:  Diagnosis Date  . Arnold-Chiari malformation, type I (HCC) 06/16/2011   Dx'd 2012 - had some headaches but improved MRI 2019 - Normal, ethmoid sinusitis  . Cholecystitis   . Eosinophilic esophagitis   . GERD (gastroesophageal reflux disease)   . Renal infection    Past Surgical History:  Procedure Laterality Date  . CHOLECYSTECTOMY  2006  . ESOPHAGOGASTRODUODENOSCOPY    . ESOPHAGOGASTRODUODENOSCOPY (EGD) WITH PROPOFOL N/A 02/22/2019   Procedure: ESOPHAGOGASTRODUODENOSCOPY (EGD) WITH PROPOFOL;  Surgeon: Toledo, Boykin Nearing, MD;  Location: [facility];  Service: Gastroenterology;  Laterality: N/A;  . ESOPHAGOGASTRODUODENOSCOPY (EGD) WITH PROPOFOL N/A 01/15/2020   Procedure: ESOPHAGOGASTRODUODENOSCOPY (EGD) WITH PROPOFOL;  Surgeon: Regis Bill, MD;  Location:  [facility];  Service: Endoscopy;  Laterality: N/A;  . ULNAR NERVE TRANSPOSITION Bilateral 2019   cubital tunnel release   Social History   Socioeconomic History  . Marital status: Married    Spouse name: Not on file  . Number of children: 0  . Years of education: Not on file  . Highest education level: Not on file  Occupational History  . Not on file  Tobacco Use  . Smoking status: Former Smoker    Types: Cigars  . Smokeless tobacco: Never Used  Vaping Use  . Vaping Use: Never used  Substance and Sexual Activity  . Alcohol use: Yes    Alcohol/week: 5.0 - 7.0 standard drinks    Types: 5 - 7 Cans of beer per week  . Drug use: No  . Sexual activity: Not on file  Other Topics Concern  . Not on file  Social History Narrative  . Not on file   Social Determinants of Health   Financial Resource Strain: Not on file  Food Insecurity: Not on file  Transportation Needs: Not on file  Physical Activity: Not on file  Stress: Not on file  Social Connections: Not on file   Allergies  Allergen Reactions  . Gadolinium Derivatives Hives  . Ivp Dye [Iodinated Diagnostic Agents]    Family History  Problem Relation Age of Onset  . Diabetes Mother   . Cancer Maternal Grandmother   . Crohn's disease Father 79      Current Outpatient Medications (Respiratory):  .  fluticasone (FLONASE) 50 MCG/ACT nasal spray, Place into the nose.    Current Outpatient  Medications (Other):  Marland Kitchen  Omeprazole-Sodium Bicarbonate (ZEGERID) 20-1100 MG CAPS capsule, Take 1 capsule by mouth daily before breakfast. .  Vitamin D, Ergocalciferol, (DRISDOL) 1.25 MG (50000 UNIT) CAPS capsule, Take 1 capsule (50,000 Units total) by mouth every 7 (seven) days.   Reviewed prior external information including notes and imaging from  primary care provider As well as notes that were available from care everywhere and other healthcare systems.  Past medical history, social, surgical and family history all  reviewed in electronic medical record.  No pertanent information unless stated regarding to the chief complaint.   Review of Systems:  No headache, visual changes, nausea, vomiting, diarrhea, constipation, dizziness, abdominal pain, skin rash, fevers, chills, night sweats, weight loss, swollen lymph nodes, body aches, joint swelling, chest pain, shortness of breath, mood changes. POSITIVE muscle aches  Objective  Blood pressure (!) 142/90, pulse 67, height 5\' 10"  (1.778 m), weight 180 lb (81.6 kg), SpO2 98 %.   General: No apparent distress alert and oriented x3 mood and affect normal, dressed appropriately.  HEENT: Pupils equal, extraocular movements intact  Respiratory: Patient's speak in full sentences and does not appear short of breath  Cardiovascular: No lower extremity edema, non tender, no erythema  Gait normal with good balance and coordination.  MSK: Left knee exam does have tenderness medial joint line. More pain over the proximal tibia. Positive percussion. Patient does have mild pain with McMurray's. Hamstring mild tightness but nothing severe. Neurovascularly intact distally.  Limited musculoskeletal ultrasound was performed and interpreted by  Limited ultrasound of patient's left knee shows that patient has no significant swelling of the patellofemoral joint. No significant narrowing the patient does have what appears to be a very small bone spur superficial on the patella going towards the quadricep tendon. Medial tibia has what appears to be a stress reaction or stress fracture noted. Cortical irregularity noted where patient is severely tender. Patient has some mild distal hamstring tendinopathy with hypoechoic changes but no acute tear increasing neovascularization or Doppler flow.  Impression: Stress reaction of the proximal tibia medially    Impression and Recommendations:     The above documentation has been reviewed and is accurate and complete Judi Saa, DO

## 2020-04-01 ENCOUNTER — Encounter: Payer: Self-pay | Admitting: Family Medicine

## 2020-04-01 ENCOUNTER — Other Ambulatory Visit: Payer: Self-pay | Admitting: Family Medicine

## 2020-04-01 ENCOUNTER — Other Ambulatory Visit: Payer: Self-pay

## 2020-04-01 ENCOUNTER — Ambulatory Visit (INDEPENDENT_AMBULATORY_CARE_PROVIDER_SITE_OTHER): Payer: 59 | Admitting: Family Medicine

## 2020-04-01 ENCOUNTER — Ambulatory Visit: Payer: Self-pay

## 2020-04-01 ENCOUNTER — Ambulatory Visit (INDEPENDENT_AMBULATORY_CARE_PROVIDER_SITE_OTHER): Payer: 59

## 2020-04-01 VITALS — BP 142/90 | HR 67 | Ht 70.0 in | Wt 180.0 lb

## 2020-04-01 DIAGNOSIS — M25562 Pain in left knee: Secondary | ICD-10-CM

## 2020-04-01 DIAGNOSIS — S86892A Other injury of other muscle(s) and tendon(s) at lower leg level, left leg, initial encounter: Secondary | ICD-10-CM

## 2020-04-01 HISTORY — DX: Other injury of other muscle(s) and tendon(s) at lower leg level, left leg, initial encounter: S86.892A

## 2020-04-01 MED ORDER — VITAMIN D (ERGOCALCIFEROL) 1.25 MG (50000 UNIT) PO CAPS: 50000.0000 [IU] | ORAL_CAPSULE | ORAL | 0 refills | Status: DC

## 2020-04-01 NOTE — Assessment & Plan Note (Addendum)
More proximal aspect.  We will put on once weekly vitamin D. Discussed K2 over-the-counter. Discussed icing regimen, home exercises. Discussed avoiding significant amount of running over the course of the next month before his race. Patient does have some very mild hypoechoic changes on the ultrasound of the hamstring as well and given some exercises for this that I think will be beneficial. Discussed icing regimen. Follow-up with me again in 6 weeks when patient is returning from the race and his 10-year wedding anniversary in [place]

## 2020-04-01 NOTE — Patient Instructions (Addendum)
Good to see you  Once weekly Vit D sent in K2 daily OTC Ice 20 minutes 2 times daily. Usually after activity and before bed. Try to do more biking and limit running to two times per week Voltaren gel 2 time daily OTC Exercises given  Get x ray on way out  See me again after the race in [place] in six weeks

## 2020-04-08 DIAGNOSIS — R1319 Other dysphagia: Secondary | ICD-10-CM | POA: Diagnosis not present

## 2020-04-08 DIAGNOSIS — R1013 Epigastric pain: Secondary | ICD-10-CM | POA: Diagnosis not present

## 2020-05-08 NOTE — Progress Notes (Signed)
[facility] [address] [place] [address] Phone: [phone] Subjective:   I Charles Simmons am serving as a Neurosurgeon for Dr. Antoine Primas.  This visit occurred during the SARS-CoV-2 public health emergency.  Safety protocols were in place, including screening questions prior to the visit, additional usage of staff PPE, and extensive cleaning of exam room while observing appropriate contact time as indicated for disinfecting solutions.   I'm seeing this patient by the request  of:  Charles Milan, MD  CC: Knee pain follow-up  [id number]   04/01/2020 More proximal aspect.  We will put on once weekly vitamin D. Discussed K2 over-the-counter. Discussed icing regimen, home exercises. Discussed avoiding significant amount of running over the course of the next month before his race. Patient does have some very mild hypoechoic changes on the ultrasound of the hamstring as well and given some exercises for this that I think will be beneficial. Discussed icing regimen. Follow-up with me again in 6 weeks when patient is returning from the race and his 10-year wedding anniversary in [place]  Update 05/09/2020 Charles Simmons is a 40 y.o. male coming in with complaint of left leg, MTSS and left knee pain. Patient states he feels the same. Did a half marathon. Did the training plain and had pain throughout the race. Pain after the race as well.  Patient states since then he has not been working out.  Still very aware of the pain.  Patient now states that it does not seem to be quite as severe.  Patient denies any radiation down the leg, any new symptoms.  Onset-  Location Duration-  Character- Aggravating factors- Reliving factors-  Therapies tried-  Severity-     Past Medical History:  Diagnosis Date  . Arnold-Chiari malformation, type I (HCC) 06/16/2011   Dx'd 2012 - had some headaches but improved MRI 2019 - Normal, ethmoid sinusitis  .  Cholecystitis   . Eosinophilic esophagitis   . GERD (gastroesophageal reflux disease)   . Renal infection    Past Surgical History:  Procedure Laterality Date  . CHOLECYSTECTOMY  2006  . ESOPHAGOGASTRODUODENOSCOPY    . ESOPHAGOGASTRODUODENOSCOPY (EGD) WITH PROPOFOL N/A 02/22/2019   Procedure: ESOPHAGOGASTRODUODENOSCOPY (EGD) WITH PROPOFOL;  Surgeon: Toledo, Boykin Nearing, MD;  Location: [facility];  Service: Gastroenterology;  Laterality: N/A;  . ESOPHAGOGASTRODUODENOSCOPY (EGD) WITH PROPOFOL N/A 01/15/2020   Procedure: ESOPHAGOGASTRODUODENOSCOPY (EGD) WITH PROPOFOL;  Surgeon: Regis Bill, MD;  Location: [facility];  Service: Endoscopy;  Laterality: N/A;  . ULNAR NERVE TRANSPOSITION Bilateral 2019   cubital tunnel release   Social History   Socioeconomic History  . Marital status: Married    Spouse name: Not on file  . Number of children: 0  . Years of education: Not on file  . Highest education level: Not on file  Occupational History  . Not on file  Tobacco Use  . Smoking status: Former Smoker    Types: Cigars  . Smokeless tobacco: Never Used  Vaping Use  . Vaping Use: Never used  Substance and Sexual Activity  . Alcohol use: Yes    Alcohol/week: 5.0 - 7.0 standard drinks    Types: 5 - 7 Cans of beer per week  . Drug use: No  . Sexual activity: Not on file  Other Topics Concern  . Not on file  Social History Narrative  . Not on file   Social Determinants of Health   Financial Resource Strain: Not on file  Food Insecurity: Not on file  Transportation Needs: Not on file  Physical Activity: Not on file  Stress: Not on file  Social Connections: Not on file   Allergies  Allergen Reactions  . Gadolinium Derivatives Hives  . Ivp Dye [Iodinated Diagnostic Agents]    Family History  Problem Relation Age of Onset  . Diabetes Mother   . Cancer Maternal Grandmother   . Crohn's disease Father 29      Current Outpatient Medications (Respiratory):  .   fluticasone (FLONASE) 50 MCG/ACT nasal spray, Place into the nose.    Current Outpatient Medications (Other):  Marland Kitchen  Omeprazole-Sodium Bicarbonate (ZEGERID) 20-1100 MG CAPS capsule, Take 1 capsule by mouth daily before breakfast. .  Vitamin D, Ergocalciferol, (DRISDOL) 1.25 MG (50000 UNIT) CAPS capsule, Take 1 capsule (50,000 Units total) by mouth every 7 (seven) days.   Reviewed prior external information including notes and imaging from  primary care provider As well as notes that were available from care everywhere and other healthcare systems.  Past medical history, social, surgical and family history all reviewed in electronic medical record.  No pertanent information unless stated regarding to the chief complaint.   Review of Systems:  No headache, visual changes, nausea, vomiting, diarrhea, constipation, dizziness, abdominal pain, skin rash, fevers, chills, night sweats, weight loss, swollen lymph nodes, body aches, joint swelling, chest pain, shortness of breath, mood changes. POSITIVE muscle aches  Objective  Blood pressure 122/90, pulse 71, height 5\' 10"  (1.778 m), weight 179 lb (81.2 kg), SpO2 97 %.   General: No apparent distress alert and oriented x3 mood and affect normal, dressed appropriately.  HEENT: Pupils equal, extraocular movements intact  Respiratory: Patient's speak in full sentences and does not appear short of breath  Cardiovascular: No lower extremity edema, non tender, no erythema  Gait normal MSK: Left knee exam shows the patient is tender to palpation over the proximal medial tibial spine.  Patient now does have pain also over the pedis anserine area and may have some mild increase in swelling in this area.  Mild pain with full extension of the knee.  Patient has a negative McMurray's.  Full range of motion of the knee noted.  Limited musculoskeletal ultrasound was performed and interpreted by  Limited ultrasound of patient's left tibia still has  some cortical irregularity near the medial joint space on the left side.  With questionable mild hypoechoic changes going towards the tibial plateau.  Patient also has what appears to be significant hypoechoic changes at the pes anserine area of the hamstring. Impression: Bursitis with continued signs notice more of a stress reaction of the medial tibial area.  Procedure: Real-time Ultrasound Guided Injection of left pes anserine  Device: GE Logiq Q7 Ultrasound guided injection is preferred based studies that show increased duration, increased effect, greater accuracy, decreased procedural pain, increased response rate, and decreased cost with ultrasound guided versus blind injection.  Verbal informed consent obtained.  Time-out conducted.  Noted no overlying erythema, induration, or other signs of local infection.  Skin prepped in a sterile fashion.  Local anesthesia: Topical Ethyl chloride.  With sterile technique and under real time ultrasound guidance: With a 25-gauge half inch needle injected with 0.5 cc of 0.5% Marcaine and 0.5 cc of Kenalog 40 mg/mL into the bursa near the insertion of the hamstring on the tibia. Completed without difficulty  Pain immediately improved suggesting accurate placement of the medication.  Advised to call if fevers/chills, erythema, induration, drainage,  or persistent bleeding.  Impression: Technically successful ultrasound guided injection.    Impression and Recommendations:     The above documentation has been reviewed and is accurate and complete Lyndal Pulley, DO

## 2020-05-09 ENCOUNTER — Ambulatory Visit: Payer: Self-pay

## 2020-05-09 ENCOUNTER — Ambulatory Visit (INDEPENDENT_AMBULATORY_CARE_PROVIDER_SITE_OTHER): Payer: 59 | Admitting: Family Medicine

## 2020-05-09 ENCOUNTER — Other Ambulatory Visit: Payer: Self-pay

## 2020-05-09 ENCOUNTER — Encounter: Payer: Self-pay | Admitting: Family Medicine

## 2020-05-09 VITALS — BP 122/90 | HR 71 | Ht 70.0 in | Wt 179.0 lb

## 2020-05-09 DIAGNOSIS — M7051 Other bursitis of knee, right knee: Secondary | ICD-10-CM | POA: Insufficient documentation

## 2020-05-09 DIAGNOSIS — G8929 Other chronic pain: Secondary | ICD-10-CM | POA: Diagnosis not present

## 2020-05-09 DIAGNOSIS — S86892A Other injury of other muscle(s) and tendon(s) at lower leg level, left leg, initial encounter: Secondary | ICD-10-CM

## 2020-05-09 DIAGNOSIS — M25562 Pain in left knee: Secondary | ICD-10-CM | POA: Diagnosis not present

## 2020-05-09 NOTE — Assessment & Plan Note (Signed)
Patient given injection today.  Tolerated the procedure well.  Discussed icing regimen.  Discussed avoiding high impact exercises and doing more low impact for the next 3 weeks.  Will increase slowly up further.  Follow-up with me again 4 to 6 weeks

## 2020-05-09 NOTE — Assessment & Plan Note (Signed)
Patient does have still some cortical irregularity noted.  Discussed with patient that this may take more time.  Patient will start to do the vitamin D supplementation with the K2 again.  Discussed icing regimen and home exercises.  Patient did have more of a bursitis noted of the medial aspect of the knee and tried an injection today.  Follow-up with me again in 4-6

## 2020-05-09 NOTE — Patient Instructions (Signed)
Good to see you Injected bursa today Looks like stress reaction Ok to bike, swim or elliptical Continue vitamin D and K2 See me again in 4-6 weeks

## 2020-05-19 ENCOUNTER — Encounter: Payer: Self-pay | Admitting: Family Medicine

## 2020-06-03 NOTE — Progress Notes (Signed)
[name] [address] [place] [address] Phone: [phone] Subjective:   Charles Simmons, am serving as a scribe for Dr. Antoine Primas. This visit occurred during the SARS-CoV-2 public health emergency.  Safety protocols were in place, including screening questions prior to the visit, additional usage of staff PPE, and extensive cleaning of exam room while observing appropriate contact time as indicated for disinfecting solutions.   I'm seeing this patient by the request  of:  Reubin Milan, MD  CC: Knee pain follow-up  [id number]   05/09/2020 Patient given injection today.  Tolerated the procedure well.  Discussed icing regimen.  Discussed avoiding high impact exercises and doing more low impact for the next 3 weeks.  Will increase slowly up further.  Follow-up with me again 4 to 6 weeks  Patient does have still some cortical irregularity noted.  Discussed with patient that this may take more time.  Patient will start to do the vitamin D supplementation with the K2 again.  Discussed icing regimen and home exercises.  Patient did have more of a bursitis noted of the medial aspect of the knee and tried an injection today.  Follow-up with me again in 4-6  Update 06/04/2020 Charles Simmons is a 40 y.o. male coming in with complaint of L knee pain. Patient states that his pain is much less than last visit. Injection did help as well as rest. Using Vit D and using second 30 day round of K2.  Patient states that he is not having any significant pain at this time.  Patient though has not been very active but like to know when he can start doing more.  Would like to start training for an Ironman.     Past Medical History:  Diagnosis Date  . Arnold-Chiari malformation, type I (HCC) 06/16/2011   Dx'd 2012 - had some headaches but improved MRI 2019 - Normal, ethmoid sinusitis  . Cholecystitis   . Eosinophilic esophagitis   . GERD  (gastroesophageal reflux disease)   . Renal infection    Past Surgical History:  Procedure Laterality Date  . CHOLECYSTECTOMY  2006  . ESOPHAGOGASTRODUODENOSCOPY    . ESOPHAGOGASTRODUODENOSCOPY (EGD) WITH PROPOFOL N/A 02/22/2019   Procedure: ESOPHAGOGASTRODUODENOSCOPY (EGD) WITH PROPOFOL;  Surgeon: Toledo, Boykin Nearing, MD;  Location: [facility];  Service: Gastroenterology;  Laterality: N/A;  . ESOPHAGOGASTRODUODENOSCOPY (EGD) WITH PROPOFOL N/A 01/15/2020   Procedure: ESOPHAGOGASTRODUODENOSCOPY (EGD) WITH PROPOFOL;  Surgeon: Regis Bill, MD;  Location: [facility];  Service: Endoscopy;  Laterality: N/A;  . ULNAR NERVE TRANSPOSITION Bilateral 2019   cubital tunnel release   Social History   Socioeconomic History  . Marital status: Married    Spouse name: Not on file  . Number of children: 0  . Years of education: Not on file  . Highest education level: Not on file  Occupational History  . Not on file  Tobacco Use  . Smoking status: Former Smoker    Types: Cigars  . Smokeless tobacco: Never Used  Vaping Use  . Vaping Use: Never used  Substance and Sexual Activity  . Alcohol use: Yes    Alcohol/week: 5.0 - 7.0 standard drinks    Types: 5 - 7 Cans of beer per week  . Drug use: No  . Sexual activity: Not on file  Other Topics Concern  . Not on file  Social History Narrative  . Not on file   Social Determinants of Health   Financial Resource  Strain: Not on file  Food Insecurity: Not on file  Transportation Needs: Not on file  Physical Activity: Not on file  Stress: Not on file  Social Connections: Not on file   Allergies  Allergen Reactions  . Gadolinium Derivatives Hives  . Ivp Dye [Iodinated Diagnostic Agents]    Family History  Problem Relation Age of Onset  . Diabetes Mother   . Cancer Maternal Grandmother   . Crohn's disease Father 25      Current Outpatient Medications (Respiratory):  .  fluticasone (FLONASE) 50 MCG/ACT nasal spray, Place  into the nose.    Current Outpatient Medications (Other):  Marland Kitchen  Omeprazole-Sodium Bicarbonate (ZEGERID) 20-1100 MG CAPS capsule, Take 1 capsule by mouth daily before breakfast. .  Vitamin D, Ergocalciferol, (DRISDOL) 1.25 MG (50000 UNIT) CAPS capsule, Take 1 capsule (50,000 Units total) by mouth every 7 (seven) days.   Reviewed prior external information including notes and imaging from  primary care provider As well as notes that were available from care everywhere and other healthcare systems.  Past medical history, social, surgical and family history all reviewed in electronic medical record.  No pertanent information unless stated regarding to the chief complaint.   Review of Systems:  No headache, visual changes, nausea, vomiting, diarrhea, constipation, dizziness, abdominal pain, skin rash, fevers, chills, night sweats, weight loss, swollen lymph nodes, body aches, joint swelling, chest pain, shortness of breath, mood changes.  Objective  Blood pressure (!) 142/102, pulse 81, height 5\' 10"  (1.778 m), weight 176 lb (79.8 kg), SpO2 97 %.   General: No apparent distress alert and oriented x3 mood and affect normal, dressed appropriately.  HEENT: Pupils equal, extraocular movements intact  Respiratory: Patient's speak in full sentences and does not appear short of breath  Cardiovascular: No lower extremity edema, non tender, no erythema  Gait normal with good balance and coordination.  MSK:  Left knee exam shows the patient is minimally tender over the Pez anserine area.  Patient though actually seems to be doing relatively well with the knee.  Patient has some mild tightness of the hamstring compared to the contralateral side.  Limited musculoskeletal ultrasound was performed and interpreted by  Limited ultrasound of patient's hamstring distally near thepes anserine area shows the patient does have some potential healing of a possible intrasubstance tear noted.   Significant swelling of the bursa in this area is completely resolved from previous exam.  No cortical irregularity noted Impression: Interval improvement of the hamstring.   Impression and Recommendations:     The above documentation has been reviewed and is accurate and complete Judi Saa, DO

## 2020-06-04 ENCOUNTER — Encounter: Payer: Self-pay | Admitting: Family Medicine

## 2020-06-04 ENCOUNTER — Other Ambulatory Visit: Payer: Self-pay

## 2020-06-04 ENCOUNTER — Ambulatory Visit (INDEPENDENT_AMBULATORY_CARE_PROVIDER_SITE_OTHER): Payer: 59 | Admitting: Family Medicine

## 2020-06-04 ENCOUNTER — Ambulatory Visit: Payer: Self-pay

## 2020-06-04 VITALS — BP 142/102 | HR 81 | Ht 70.0 in | Wt 176.0 lb

## 2020-06-04 DIAGNOSIS — M6289 Other specified disorders of muscle: Secondary | ICD-10-CM | POA: Diagnosis not present

## 2020-06-04 DIAGNOSIS — G8929 Other chronic pain: Secondary | ICD-10-CM | POA: Diagnosis not present

## 2020-06-04 DIAGNOSIS — M25562 Pain in left knee: Secondary | ICD-10-CM | POA: Diagnosis not present

## 2020-06-04 DIAGNOSIS — S86892D Other injury of other muscle(s) and tendon(s) at lower leg level, left leg, subsequent encounter: Secondary | ICD-10-CM

## 2020-06-04 HISTORY — DX: Other specified disorders of muscle: M62.89

## 2020-06-04 NOTE — Patient Instructions (Signed)
Good to see you  Ice 20 minutes 2 times daily. Usually after activity and before bed. Thigh compression with biking and running Ok to run 2 times a week starting in may for 2 weeks Then 3 times a week thereafter One more visit in 7-8 weeks

## 2020-06-04 NOTE — Assessment & Plan Note (Signed)
Patient does have some findings of this.  Discussed thigh compression sleeve, discussed different exercises in standing with biking and swimming and then progressing to running in the course of a couple weeks.  Follow-up with me again in 8 weeks to make sure no significant progression or irritation has occurred.

## 2020-06-04 NOTE — Assessment & Plan Note (Signed)
Patient seems to be doing significantly better at this time.  Patient will finish up with the K2, discussed continuing the vitamin D for another 2 weeks and then can discontinue this or go to a daily of 2 to 4000 IUs daily.  Discussed icing regimen and home exercises.  Patient will have a slow progression to increase his activity again.  Follow-up with me again 6 to 8 weeks

## 2020-06-11 ENCOUNTER — Other Ambulatory Visit: Payer: Self-pay

## 2020-06-11 MED FILL — Omeprazole Cap Delayed Release 40 MG: ORAL | 30 days supply | Qty: 60 | Fill #0 | Status: AC

## 2020-06-13 ENCOUNTER — Other Ambulatory Visit: Payer: Self-pay

## 2020-06-29 ENCOUNTER — Encounter: Payer: Self-pay | Admitting: Internal Medicine

## 2020-06-29 NOTE — Progress Notes (Signed)
Date:  06/30/2020   Name:  Charles Simmons   DOB:  04-21-1980   MRN:  [id number]   Chief Complaint: Annual Exam  Charles Simmons is a 40 y.o. male who presents today for his Complete Annual Exam. He feels well. He reports exercising - limited to a few days because stress fractures in legs. He reports he is sleeping fairly well. He now has an expanded job role with [employer] and has some associated stress.  Colonoscopy: not due  Immunization History  Administered Date(s) Administered  . Influenza, High Dose Seasonal PF 12/03/2014  . Influenza,inj,Quad PF,6+ Mos 02/25/2017, 01/27/2018  . Tdap 12/28/2014    Gastroesophageal Reflux He complains of abdominal pain and dysphagia. He reports no chest pain, no choking or no wheezing. This is a recurrent problem. The problem occurs occasionally. Pertinent negatives include no fatigue. He has tried a PPI for the symptoms. Past procedures include an EGD. negative for eos and dysplasia.    Lab Results  Component Value Date   CREATININE 1.01 08/31/2018   BUN 14 08/31/2018   NA 137 08/31/2018   K 4.5 08/31/2018   CL 99 08/31/2018   CO2 26 08/31/2018   Lab Results  Component Value Date   CHOL 166 08/31/2018   HDL 47 08/31/2018   LDLCALC 108 (H) 08/31/2018   TRIG 54 08/31/2018   CHOLHDL 3.5 08/31/2018   No results found for: TSH No results found for: HGBA1C Lab Results  Component Value Date   WBC 4.5 08/31/2018   HGB 15.5 08/31/2018   HCT 46.2 08/31/2018   MCV 102 (H) 08/31/2018   PLT 266 08/31/2018   Lab Results  Component Value Date   ALT 15 08/31/2018   AST 16 08/31/2018   ALKPHOS 85 08/31/2018   BILITOT 1.6 (H) 08/31/2018     Review of Systems  Constitutional: Negative for appetite change, chills, diaphoresis, fatigue and unexpected weight change.  HENT: Positive for trouble swallowing. Negative for hearing loss, tinnitus and voice change.   Eyes: Negative for visual disturbance.  Respiratory: Negative for  choking, shortness of breath and wheezing.   Cardiovascular: Negative for chest pain, palpitations and leg swelling.  Gastrointestinal: Positive for abdominal pain and dysphagia. Negative for blood in stool, constipation and diarrhea.  Genitourinary: Negative for difficulty urinating, dysuria and frequency.  Musculoskeletal: Positive for arthralgias (knee pain). Negative for back pain and myalgias.  Skin: Negative for color change and rash.  Allergic/Immunologic: Negative for environmental allergies.  Neurological: Negative for dizziness, syncope and headaches.  Hematological: Negative for adenopathy.  Psychiatric/Behavioral: Negative for dysphoric mood and sleep disturbance.    Patient Active Problem List   Diagnosis Date Noted  . Hamstring tightness of left lower extremity 06/04/2020  . Pes anserinus bursitis of right knee 05/09/2020  . Left medial tibial stress syndrome 04/01/2020  . Chronic sinusitis, unspecified 08/31/2018    Allergies  Allergen Reactions  . Gadolinium Derivatives Hives  . Ivp Dye [Iodinated Diagnostic Agents]     Past Surgical History:  Procedure Laterality Date  . CHOLECYSTECTOMY  2006  . ESOPHAGOGASTRODUODENOSCOPY    . ESOPHAGOGASTRODUODENOSCOPY (EGD) WITH PROPOFOL N/A 02/22/2019   Procedure: ESOPHAGOGASTRODUODENOSCOPY (EGD) WITH PROPOFOL;  Surgeon: Toledo, Boykin Nearing, MD;  Location: [facility] ENDOSCOPY;  Service: Gastroenterology;  Laterality: N/A;  . ESOPHAGOGASTRODUODENOSCOPY (EGD) WITH PROPOFOL N/A 01/15/2020   Procedure: ESOPHAGOGASTRODUODENOSCOPY (EGD) WITH PROPOFOL;  Surgeon: Regis Bill, MD;  Location: [facility] ENDOSCOPY;  Service: Endoscopy;  Laterality: N/A;  . ULNAR NERVE  TRANSPOSITION Bilateral 2019   cubital tunnel release    Social History   Tobacco Use  . Smoking status: Former Smoker    Types: Cigars  . Smokeless tobacco: Never Used  Vaping Use  . Vaping Use: Never used  Substance Use Topics  . Alcohol use: Yes    Alcohol/week:  5.0 - 7.0 standard drinks    Types: 5 - 7 Cans of beer per week  . Drug use: No     Medication list has been reviewed and updated.  Current Meds  Medication Sig  . fluticasone (FLONASE) 50 MCG/ACT nasal spray Place into the nose.  Marland Kitchen omeprazole (PRILOSEC) 40 MG capsule TAKE 1 CAPSULE BY MOUTH TWICE DAILY BEFORE MEALS. TAKE 15-20 MINUTES BEFORE MEALS.    PHQ 2/9 Scores 06/30/2020 08/31/2018 02/25/2017  PHQ - 2 Score 0 0 0  PHQ- 9 Score 0 0 -    GAD 7 : Generalized Anxiety Score 06/30/2020  Nervous, Anxious, on Edge 0  Control/stop worrying 0  Worry too much - different things 0  Trouble relaxing 0  Restless 0  Easily annoyed or irritable 0  Afraid - awful might happen 0  Total GAD 7 Score 0  Anxiety Difficulty Not difficult at all    BP Readings from Last 3 Encounters:  06/30/20 134/84  06/04/20 (!) 142/102  05/09/20 122/90    Physical Exam Vitals and nursing note reviewed.  Constitutional:      Appearance: Normal appearance. He is well-developed.  HENT:     Head: Normocephalic.     Right Ear: Tympanic membrane, ear canal and external ear normal.     Left Ear: Tympanic membrane, ear canal and external ear normal.     Nose: Nose normal.  Eyes:     Conjunctiva/sclera: Conjunctivae normal.     Pupils: Pupils are equal, round, and reactive to light.  Neck:     Thyroid: No thyromegaly.     Vascular: No carotid bruit.  Cardiovascular:     Rate and Rhythm: Normal rate and regular rhythm.     Heart sounds: Normal heart sounds.  Pulmonary:     Effort: Pulmonary effort is normal.     Breath sounds: Normal breath sounds. No wheezing.  Chest:  Breasts:     Right: No mass.     Left: No mass.    Abdominal:     General: Abdomen is flat. Bowel sounds are normal.     Palpations: Abdomen is soft.     Tenderness: There is no abdominal tenderness. There is no guarding or rebound.     Hernia: No hernia is present.  Musculoskeletal:        General: Normal range of motion.      Cervical back: Normal range of motion and neck supple.  Lymphadenopathy:     Cervical: No cervical adenopathy.  Skin:    General: Skin is warm and dry.     Capillary Refill: Capillary refill takes less than 2 seconds.  Neurological:     Mental Status: He is alert and oriented to person, place, and time.     Deep Tendon Reflexes: Reflexes are normal and symmetric.  Psychiatric:        Attention and Perception: Attention normal.        Mood and Affect: Mood normal.     Wt Readings from Last 3 Encounters:  06/30/20 180 lb 9.6 oz (81.9 kg)  06/04/20 176 lb (79.8 kg)  05/09/20 179 lb (81.2 kg)  BP 134/84   Pulse 68   Temp (!) 97.4 F (36.3 C) (Oral)   Ht 5\' 10"  (1.778 m)   Wt 180 lb 9.6 oz (81.9 kg)   SpO2 99%   BMI 25.91 kg/m   Assessment and Plan: 1. Annual physical exam Normal exam; continue healthy diet and exercise as able as knee improved - CBC with Differential/Platelet - Comprehensive metabolic panel - Lipid panel  2. Need for hepatitis C screening test - Hepatitis C antibody  3. Other dysphagia Seen by GI at [facility] On omeprazole; planning Barium swallow   Partially dictated using [facility]. Any errors are unintentional.  Animal nutritionist, MD [facility]  06/30/2020

## 2020-06-30 ENCOUNTER — Other Ambulatory Visit: Payer: Self-pay

## 2020-06-30 ENCOUNTER — Ambulatory Visit (INDEPENDENT_AMBULATORY_CARE_PROVIDER_SITE_OTHER): Payer: 59 | Admitting: Internal Medicine

## 2020-06-30 ENCOUNTER — Encounter: Payer: Self-pay | Admitting: Internal Medicine

## 2020-06-30 VITALS — BP 134/84 | HR 68 | Temp 97.4°F | Ht 70.0 in | Wt 180.6 lb

## 2020-06-30 DIAGNOSIS — R1319 Other dysphagia: Secondary | ICD-10-CM

## 2020-06-30 DIAGNOSIS — Z1159 Encounter for screening for other viral diseases: Secondary | ICD-10-CM | POA: Diagnosis not present

## 2020-06-30 DIAGNOSIS — Z0000 Encounter for general adult medical examination without abnormal findings: Secondary | ICD-10-CM | POA: Diagnosis not present

## 2020-07-01 ENCOUNTER — Encounter: Payer: Self-pay | Admitting: Internal Medicine

## 2020-07-01 LAB — COMPREHENSIVE METABOLIC PANEL
ALT: 18 IU/L (ref 0–44)
AST: 17 IU/L (ref 0–40)
Albumin/Globulin Ratio: 1.5 (ref 1.2–2.2)
Albumin: 4.6 g/dL (ref 4.0–5.0)
Alkaline Phosphatase: 92 IU/L (ref 44–121)
BUN/Creatinine Ratio: 13 (ref 9–20)
BUN: 13 mg/dL (ref 6–20)
Bilirubin Total: 1.5 mg/dL — ABNORMAL HIGH (ref 0.0–1.2)
CO2: 24 mmol/L (ref 20–29)
Calcium: 9.9 mg/dL (ref 8.7–10.2)
Chloride: 99 mmol/L (ref 96–106)
Creatinine, Ser: 1.02 mg/dL (ref 0.76–1.27)
Globulin, Total: 3 g/dL (ref 1.5–4.5)
Glucose: 91 mg/dL (ref 65–99)
Potassium: 4.3 mmol/L (ref 3.5–5.2)
Sodium: 139 mmol/L (ref 134–144)
Total Protein: 7.6 g/dL (ref 6.0–8.5)
eGFR: 96 mL/min/{1.73_m2} (ref >59)

## 2020-07-01 LAB — CBC WITH DIFFERENTIAL/PLATELET
Basophils Absolute: 0 10*3/uL (ref 0.0–0.2)
Basos: 1 %
EOS (ABSOLUTE): 0.2 10*3/uL (ref 0.0–0.4)
Eos: 3 %
Hematocrit: 43.6 % (ref 37.5–51.0)
Hemoglobin: 15.1 g/dL (ref 13.0–17.7)
Immature Grans (Abs): 0 10*3/uL (ref 0.0–0.1)
Immature Granulocytes: 0 %
Lymphocytes Absolute: 1.5 10*3/uL (ref 0.7–3.1)
Lymphs: 29 %
MCH: 34.1 pg — ABNORMAL HIGH (ref 26.6–33.0)
MCHC: 34.6 g/dL (ref 31.5–35.7)
MCV: 98 fL — ABNORMAL HIGH (ref 79–97)
Monocytes Absolute: 0.4 10*3/uL (ref 0.1–0.9)
Monocytes: 8 %
Neutrophils Absolute: 3 10*3/uL (ref 1.4–7.0)
Neutrophils: 59 %
Platelets: 262 10*3/uL (ref 150–450)
RBC: 4.43 x10E6/uL (ref 4.14–5.80)
RDW: 12.1 % (ref 11.6–15.4)
WBC: 5.1 10*3/uL (ref 3.4–10.8)

## 2020-07-01 LAB — LIPID PANEL
Chol/HDL Ratio: 3.6 ratio (ref 0.0–5.0)
Cholesterol, Total: 184 mg/dL (ref 100–199)
HDL: 51 mg/dL (ref >39)
LDL Chol Calc (NIH): 119 mg/dL — ABNORMAL HIGH (ref 0–99)
Triglycerides: 73 mg/dL (ref 0–149)
VLDL Cholesterol Cal: 14 mg/dL (ref 5–40)

## 2020-07-01 LAB — HEPATITIS C ANTIBODY: Hep C Virus Ab: <0.1 s/co ratio (ref 0.0–0.9)

## 2020-07-10 DIAGNOSIS — H52203 Unspecified astigmatism, bilateral: Secondary | ICD-10-CM | POA: Diagnosis not present

## 2020-07-22 NOTE — Progress Notes (Signed)
[facility] [address] [place] [address] Phone: [phone] Subjective:   I [name] am serving as a Neurosurgeon for Dr. Antoine Primas.  This visit occurred during the SARS-CoV-2 public health emergency.  Safety protocols were in place, including screening questions prior to the visit, additional usage of staff PPE, and extensive cleaning of exam room while observing appropriate contact time as indicated for disinfecting solutions.   I'm seeing this patient by the request  of:  Reubin Milan, MD  CC: Left leg pain follow-up  IDP:[id number]   06/04/2020 Patient does have some findings of this.  Discussed thigh compression sleeve, discussed different exercises in standing with biking and swimming and then progressing to running in the course of a couple weeks.  Follow-up with me again in 8 weeks to make sure no significant progression or irritation has occurred.  Patient seems to be doing significantly better at this time.  Patient will finish up with the K2, discussed continuing the vitamin D for another 2 weeks and then can discontinue this or go to a daily of 2 to 4000 IUs daily.  Discussed icing regimen and home exercises.  Patient will have a slow progression to increase his activity again.  Follow-up with me again 6 to 8 weeks  Update 07/23/2020 Charles Simmons is a 40 y.o. male coming in with complaint of L hamstring and L shin pain. Patient states he is doing well. No pain. Has been exercising.  Patient is feeling significantly better.  Was able to start his CrossFit on a more regular basis.  Is going to start running to get ready for his Ironman in the next 13 months.      Past Medical History:  Diagnosis Date  . Arnold-Chiari malformation, type I (HCC) 06/16/2011   Dx'd 2012 - had some headaches but improved MRI 2019 - Normal, ethmoid sinusitis  . Cholecystitis   . Eosinophilic esophagitis   . Eosinophilic esophagitis 02/25/2017  .  GERD (gastroesophageal reflux disease)   . Renal infection    Past Surgical History:  Procedure Laterality Date  . CHOLECYSTECTOMY  2006  . ESOPHAGOGASTRODUODENOSCOPY    . ESOPHAGOGASTRODUODENOSCOPY (EGD) WITH PROPOFOL N/A 02/22/2019   Procedure: ESOPHAGOGASTRODUODENOSCOPY (EGD) WITH PROPOFOL;  Surgeon: Toledo, Boykin Nearing, MD;  Location: [facility] ENDOSCOPY;  Service: Gastroenterology;  Laterality: N/A;  . ESOPHAGOGASTRODUODENOSCOPY (EGD) WITH PROPOFOL N/A 01/15/2020   Procedure: ESOPHAGOGASTRODUODENOSCOPY (EGD) WITH PROPOFOL;  Surgeon: Regis Bill, MD;  Location: [facility] ENDOSCOPY;  Service: Endoscopy;  Laterality: N/A;  . ULNAR NERVE TRANSPOSITION Bilateral 2019   cubital tunnel release   Social History   Socioeconomic History  . Marital status: Married    Spouse name: Bartholomew Boards  . Number of children: 0  . Years of education: Not on file  . Highest education level: Not on file  Occupational History  . Not on file  Tobacco Use  . Smoking status: Former Smoker    Types: Cigars  . Smokeless tobacco: Never Used  Vaping Use  . Vaping Use: Never used  Substance and Sexual Activity  . Alcohol use: Yes    Alcohol/week: 5.0 - 7.0 standard drinks    Types: 5 - 7 Cans of beer per week  . Drug use: No  . Sexual activity: Yes    Partners: Female  Other Topics Concern  . Not on file  Social History Narrative  . Not on file   Social Determinants of Health   Financial Resource  Strain: Not on file  Food Insecurity: Not on file  Transportation Needs: Not on file  Physical Activity: Not on file  Stress: Not on file  Social Connections: Not on file   Allergies  Allergen Reactions  . Gadolinium Derivatives Hives  . Ivp Dye [Iodinated Diagnostic Agents]    Family History  Problem Relation Age of Onset  . Diabetes Mother   . Cancer Maternal Grandmother   . Crohn's disease Father 42      Current Outpatient Medications (Respiratory):  .  fluticasone (FLONASE) 50 MCG/ACT  nasal spray, Place into the nose.    Current Outpatient Medications (Other):  .  omeprazole (PRILOSEC) 40 MG capsule, TAKE 1 CAPSULE BY MOUTH TWICE DAILY BEFORE MEALS. TAKE 15-20 MINUTES BEFORE MEALS.    Review of Systems:  No headache, visual changes, nausea, vomiting, diarrhea, constipation, dizziness, abdominal pain, skin rash, fevers, chills, night sweats, weight loss, swollen lymph nodes, body aches, joint swelling, chest pain, shortness of breath, mood changes.  Objective  Blood pressure (!) 144/90, pulse 82, height 5\' 10"  (1.778 m), weight 183 lb (83 kg), SpO2 98 %.   General: No apparent distress alert and oriented x3 mood and affect normal, dressed appropriately.  HEENT: Pupils equal, extraocular movements intact  Respiratory: Patient's speak in full sentences and does not appear short of breath  Cardiovascular: No lower extremity edema, non tender, no erythema  Gait normal with good balance and coordination.  MSK:  Non tender with full range of motion and good stability and symmetric strength and tone of shoulders, elbows, wrist, hip, knee and ankles bilaterally.  On exam today no significant findings of any type of pain.  Full strength of the left knee and leg noted.   Impression and Recommendations:     The above documentation has been reviewed and is accurate and complete , DO

## 2020-07-23 ENCOUNTER — Encounter: Payer: Self-pay | Admitting: Family Medicine

## 2020-07-23 ENCOUNTER — Ambulatory Visit (INDEPENDENT_AMBULATORY_CARE_PROVIDER_SITE_OTHER): Payer: 59 | Admitting: Family Medicine

## 2020-07-23 ENCOUNTER — Other Ambulatory Visit: Payer: Self-pay

## 2020-07-23 DIAGNOSIS — S86892D Other injury of other muscle(s) and tendon(s) at lower leg level, left leg, subsequent encounter: Secondary | ICD-10-CM

## 2020-07-23 DIAGNOSIS — M6289 Other specified disorders of muscle: Secondary | ICD-10-CM | POA: Diagnosis not present

## 2020-07-23 NOTE — Assessment & Plan Note (Signed)
Patient is doing significantly better at this time.  No longer needs the compression.  Can follow-up with me more on an as-needed basis.

## 2020-07-23 NOTE — Assessment & Plan Note (Signed)
No signs of any type of irritation in the area of the tibia.  Patient can increase activity.  Was able to workout with no significant difficulty.  Follow-up as needed

## 2020-07-23 NOTE — Patient Instructions (Signed)
Good to see you See me again in  

## 2020-08-21 ENCOUNTER — Other Ambulatory Visit: Payer: Self-pay

## 2020-08-21 MED FILL — Omeprazole Cap Delayed Release 40 MG: ORAL | 30 days supply | Qty: 60 | Fill #1 | Status: AC

## 2020-09-12 ENCOUNTER — Ambulatory Visit: Payer: Self-pay | Admitting: *Deleted

## 2020-09-12 DIAGNOSIS — K2100 Gastro-esophageal reflux disease with esophagitis, without bleeding: Secondary | ICD-10-CM | POA: Insufficient documentation

## 2020-09-12 DIAGNOSIS — K219 Gastro-esophageal reflux disease without esophagitis: Secondary | ICD-10-CM | POA: Insufficient documentation

## 2020-09-12 NOTE — Telephone Encounter (Signed)
Pt is calling because he tested positive today for COVID - Pt sx are congestion, soe throat, cough, sore throat, body aches, chills. Pt is wanting to know what he can take.   Called patient to review sx . No answer, LVMTCB

## 2020-09-27 ENCOUNTER — Encounter: Payer: Self-pay | Admitting: Family Medicine

## 2020-09-27 DIAGNOSIS — S92405A Nondisplaced unspecified fracture of left great toe, initial encounter for closed fracture: Secondary | ICD-10-CM | POA: Diagnosis not present

## 2020-10-13 DIAGNOSIS — S92405A Nondisplaced unspecified fracture of left great toe, initial encounter for closed fracture: Secondary | ICD-10-CM | POA: Diagnosis not present

## 2020-10-21 ENCOUNTER — Other Ambulatory Visit: Payer: Self-pay

## 2020-10-21 MED FILL — Omeprazole Cap Delayed Release 40 MG: ORAL | 30 days supply | Qty: 60 | Fill #2 | Status: AC

## 2020-11-12 DIAGNOSIS — S92405A Nondisplaced unspecified fracture of left great toe, initial encounter for closed fracture: Secondary | ICD-10-CM | POA: Diagnosis not present

## 2020-12-16 DIAGNOSIS — R1319 Other dysphagia: Secondary | ICD-10-CM | POA: Diagnosis not present

## 2020-12-16 DIAGNOSIS — K920 Hematemesis: Secondary | ICD-10-CM | POA: Diagnosis not present

## 2020-12-17 ENCOUNTER — Encounter: Payer: Self-pay | Admitting: Gastroenterology

## 2020-12-18 ENCOUNTER — Encounter
Admission: RE | Disposition: A | Discharge status: Home / Self Care | Payer: Self-pay | Source: Ambulatory Visit | Attending: Gastroenterology

## 2020-12-18 ENCOUNTER — Encounter: Payer: Self-pay | Admitting: Gastroenterology

## 2020-12-18 ENCOUNTER — Ambulatory Visit
Admission: RE | Admit: 2020-12-18 | Discharge: 2020-12-18 | Disposition: A | Discharge status: Home / Self Care | Payer: 59 | Source: Ambulatory Visit | Attending: Gastroenterology | Admitting: Gastroenterology

## 2020-12-18 ENCOUNTER — Ambulatory Visit: Payer: 59 | Admitting: Anesthesiology

## 2020-12-18 DIAGNOSIS — Z91041 Radiographic dye allergy status: Secondary | ICD-10-CM | POA: Diagnosis not present

## 2020-12-18 DIAGNOSIS — R1319 Other dysphagia: Secondary | ICD-10-CM | POA: Diagnosis not present

## 2020-12-18 DIAGNOSIS — Z79899 Other long term (current) drug therapy: Secondary | ICD-10-CM | POA: Diagnosis not present

## 2020-12-18 DIAGNOSIS — K200 Eosinophilic esophagitis: Secondary | ICD-10-CM | POA: Diagnosis not present

## 2020-12-18 DIAGNOSIS — F17200 Nicotine dependence, unspecified, uncomplicated: Secondary | ICD-10-CM | POA: Diagnosis not present

## 2020-12-18 DIAGNOSIS — K2100 Gastro-esophageal reflux disease with esophagitis, without bleeding: Secondary | ICD-10-CM | POA: Diagnosis not present

## 2020-12-18 DIAGNOSIS — R1314 Dysphagia, pharyngoesophageal phase: Secondary | ICD-10-CM | POA: Diagnosis not present

## 2020-12-18 DIAGNOSIS — K208 Other esophagitis without bleeding: Secondary | ICD-10-CM | POA: Diagnosis not present

## 2020-12-18 HISTORY — DX: Allergy status to unspecified drugs, medicaments and biological substances: Z88.9

## 2020-12-18 HISTORY — PX: ESOPHAGOGASTRODUODENOSCOPY (EGD) WITH PROPOFOL: SHX5813

## 2020-12-18 SURGERY — ESOPHAGOGASTRODUODENOSCOPY (EGD) WITH PROPOFOL
Anesthesia: General

## 2020-12-18 MED ORDER — PROPOFOL 500 MG/50ML IV EMUL
INTRAVENOUS | Status: AC
  Filled 2020-12-18: qty 50

## 2020-12-18 MED ORDER — SODIUM CHLORIDE 0.9 % IV SOLN
INTRAVENOUS | Status: DC
  Administered 2020-12-18: 1000 mL via INTRAVENOUS

## 2020-12-18 MED ORDER — FENTANYL CITRATE (PF) 100 MCG/2ML IJ SOLN
INTRAMUSCULAR | Status: AC
  Filled 2020-12-18: qty 2

## 2020-12-18 MED ORDER — FENTANYL CITRATE (PF) 100 MCG/2ML IJ SOLN
INTRAMUSCULAR | Status: DC | PRN
  Administered 2020-12-18: 50 ug via INTRAVENOUS

## 2020-12-18 MED ORDER — DEXMEDETOMIDINE HCL IN NACL 200 MCG/50ML IV SOLN
INTRAVENOUS | Status: DC | PRN
  Administered 2020-12-18: 4 ug via INTRAVENOUS
  Administered 2020-12-18: 8 ug via INTRAVENOUS

## 2020-12-18 MED ORDER — PROPOFOL 500 MG/50ML IV EMUL
INTRAVENOUS | Status: DC | PRN
  Administered 2020-12-18: 150 ug/kg/min via INTRAVENOUS

## 2020-12-18 NOTE — Op Note (Signed)
[facility] Gastroenterology Patient Name: Charles Simmons Procedure Date: 12/18/2020 12:14 PM MRN: [id number] Account #: [id number] Date of Birth: April 14, 1980 Admit Type: Outpatient Age: 40 Room: [facility] ENDO ROOM 1 Gender: Male Note Status: Finalized Instrument Name: Upper Endoscope [id number] Procedure:             Upper GI endoscopy Indications:           Esophageal dysphagia Providers:             Annamaria Helling DO, DO Medicines:             Monitored Anesthesia Care Complications:         No immediate complications. Estimated blood loss:                         Minimal. Procedure:             Pre-Anesthesia Assessment:                        - Prior to the procedure, a History and Physical was                         performed, and patient medications and allergies were                         reviewed. The patient is competent. The risks and                         benefits of the procedure and the sedation options and                         risks were discussed with the patient. All questions                         were answered and informed consent was obtained.                         Patient identification and proposed procedure were                         verified by the physician, the nurse, the anesthetist                         and the technician in the endoscopy suite. Mental                         Status Examination: alert and oriented. Airway                         Examination: normal oropharyngeal airway and neck                         mobility. Respiratory Examination: clear to                         auscultation. CV Examination: RRR, no murmurs, no S3                         or S4. Prophylactic Antibiotics: The patient does not  require prophylactic antibiotics. Prior                         Anticoagulants: The patient has taken no previous                         anticoagulant or antiplatelet agents. ASA Grade                          Assessment: II - A patient with mild systemic disease.                         After reviewing the risks and benefits, the patient                         was deemed in satisfactory condition to undergo the                         procedure. The anesthesia plan was to use monitored                         anesthesia care (MAC). Immediately prior to                         administration of medications, the patient was                         re-assessed for adequacy to receive sedatives. The                         heart rate, respiratory rate, oxygen saturations,                         blood pressure, adequacy of pulmonary ventilation, and                         response to care were monitored throughout the                         procedure. The physical status of the patient was                         re-assessed after the procedure.                        After obtaining informed consent, the endoscope was                         passed under direct vision. Throughout the procedure,                         the patient's blood pressure, pulse, and oxygen                         saturations were monitored continuously. The Endoscope                         was introduced through the mouth, and advanced to the  second part of duodenum. The upper GI endoscopy was                         accomplished without difficulty. The patient tolerated                         the procedure well. Findings:      The duodenal bulb, first portion of the duodenum and second portion of       the duodenum were normal. Estimated blood loss: none.      The entire examined stomach was normal. Estimated blood loss: none.      Mucosal changes including ringed esophagus were found in the proximal       esophagus, in the mid esophagus and in the distal esophagus. Esophageal       findings were graded using the Eosinophilic Esophagitis Endoscopic       Reference  Score (EoE-EREFS) as: Edema Grade 1 Present (decreased clarity       or absence of vascular markings), Rings Grade 2 Moderate (distinct rings       that do not occlude passage of diagnostic 8-10 mm endoscope), Exudates       Grade 0 None (no white lesions seen), Furrows Grade 0 None (no vertical       lines seen) and Stricture none (no stricture found). Biopsies were taken       with a cold forceps for histology. Estimated blood loss was minimal. A       guidewire was placed and the scope was withdrawn. Dilation was performed       with a Savary dilator with no resistance at 45 Fr, 48 Fr and 51 Fr. The       dilation site was examined following endoscope reinsertion and showed no       change. Estimated blood loss: none.      The exam of the esophagus was otherwise normal. Impression:            - Normal duodenal bulb, first portion of the duodenum                         and second portion of the duodenum.                        - Normal stomach.                        - Esophageal mucosal changes secondary to eosinophilic                         esophagitis. Biopsied. Dilated. Recommendation:        - Discharge patient to home.                        - Soft diet.                        - Continue present medications.                        - Await pathology results.                        - Return to referring physician as previously  scheduled. Procedure Code(s):     --- Professional ---                        [phone], Esophagogastroduodenoscopy, flexible,                         transoral; with insertion of guide wire followed by                         passage of dilator(s) through esophagus over guide wire                        [id number], 20, Esophagogastroduodenoscopy, flexible,                         transoral; with biopsy, single or multiple Diagnosis Code(s):     --- Professional ---                        Z30.8, Eosinophilic esophagitis                         R13.14, Dysphagia, pharyngoesophageal phase CPT copyright 2019 [employer]. All rights reserved. The codes documented in this report are preliminary and upon coder review may  be revised to meet current compliance requirements. Attending Participation:      I personally performed the entire procedure. Volney American, DO Annamaria Helling DO, DO 12/18/2020 12:53:29 PM This report has been signed electronically. Number of Addenda: 0 Note Initiated On: 12/18/2020 12:14 PM Estimated Blood Loss:  Estimated blood loss was minimal.      [facility]

## 2020-12-18 NOTE — H&P (Signed)
[facility] Pre-Procedure H&P   Patient ID: Charles Simmons is a 40 y.o. male.  Gastroenterology Provider: Jaynie Collins, DO  Referring Provider: Vevelyn Pat, NP PCP: Reubin Milan, MD  Date: 12/18/2020  HPI Mr. Charles Simmons is a 40 y.o. male who presents today for Esophagogastroduodenoscopy for dysphagia.  History of eoe, has had trouble swallowing over the last few months while still taking ppi.   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   Dx'd 2012 - had some headaches but improved MRI 2019 - Normal, ethmoid sinusitis   Cholecystitis    Eosinophilic esophagitis    Eosinophilic esophagitis 02/25/2017   GERD (gastroesophageal reflux disease)    Renal infection     Past Surgical History:  Procedure Laterality Date   CHOLECYSTECTOMY  2006   ESOPHAGOGASTRODUODENOSCOPY     ESOPHAGOGASTRODUODENOSCOPY (EGD) WITH PROPOFOL N/A 02/22/2019   Procedure: ESOPHAGOGASTRODUODENOSCOPY (EGD) WITH PROPOFOL;  Surgeon: Toledo, Boykin Nearing, MD;  Location: [facility] ENDOSCOPY;  Service: Gastroenterology;  Laterality: N/A;   ESOPHAGOGASTRODUODENOSCOPY (EGD) WITH PROPOFOL N/A 01/15/2020   Procedure: ESOPHAGOGASTRODUODENOSCOPY (EGD) WITH PROPOFOL;  Surgeon: Regis Bill, MD;  Location: [facility] ENDOSCOPY;  Service: Endoscopy;  Laterality: N/A;   ULNAR NERVE TRANSPOSITION Bilateral 2019   cubital tunnel release    Family History No h/o GI disease or malignancy  Review of Systems  Constitutional:  Negative for activity change, appetite change, chills, fatigue, fever and unexpected weight change.  HENT:  Positive for trouble swallowing. Negative for voice change.   Respiratory:  Negative for shortness of breath.   Cardiovascular:  Positive for chest pain. Negative for palpitations.  Gastrointestinal:  Negative for abdominal distention, abdominal pain, anal bleeding, blood in  stool, constipation, diarrhea, nausea and vomiting.  Musculoskeletal:  Negative for arthralgias and myalgias.  Skin:  Negative for color change and pallor.  Neurological:  Negative for dizziness, syncope and weakness.  Psychiatric/Behavioral:  Negative for confusion. The patient is not nervous/anxious.   All other systems reviewed and are negative.   Medications No current facility-administered medications on file prior to encounter.   Current Outpatient Medications on File Prior to Encounter  Medication Sig Dispense Refill   Cholecalciferol (VITAMIN D3 PO) Take by mouth daily.     fluticasone (FLONASE) 50 MCG/ACT nasal spray Place into the nose.     omeprazole (PRILOSEC) 40 MG capsule TAKE 1 CAPSULE BY MOUTH TWICE DAILY BEFORE MEALS. TAKE 15-20 MINUTES BEFORE MEALS. 60 capsule 11    Pertinent medications related to GI and procedure were reviewed by me with the patient prior to the procedure   Current Facility-Administered Medications:    0.9 %  sodium chloride infusion, , Intravenous, Continuous, Jaynie Collins, DO, Last Rate: 20 mL/hr at 12/18/20 1107, 1,000 mL at 12/18/20 1107  sodium chloride 1,000 mL (12/18/20 1107)       Allergies  Allergen Reactions   Gadolinium Derivatives Hives   Ivp Dye [Iodinated Diagnostic Agents]    Allergies were reviewed by me prior to the procedure  Objective    Vitals:   12/18/20 1054  BP: (!) 132/91  Pulse: 69  Resp: 18  Temp: (!) 97.4 F (36.3 C)  TempSrc: Temporal  SpO2: 100%  Weight: 80.9 kg  Height: 5\' 10"  (1.778 m)     Physical Exam Vitals and nursing note reviewed.  Constitutional:      General: He is not in acute distress.  Appearance: Normal appearance. He is not ill-appearing, toxic-appearing or diaphoretic.  HENT:     Head: Normocephalic and atraumatic.     Nose: Nose normal.     Mouth/Throat:     Mouth: Mucous membranes are moist.     Pharynx: Oropharynx is clear.  Eyes:     General: No scleral  icterus.    Extraocular Movements: Extraocular movements intact.  Cardiovascular:     Rate and Rhythm: Normal rate and regular rhythm.     Heart sounds: Normal heart sounds. No murmur heard.   No friction rub. No gallop.  Pulmonary:     Effort: Pulmonary effort is normal. No respiratory distress.     Breath sounds: Normal breath sounds. No wheezing, rhonchi or rales.  Abdominal:     General: Abdomen is flat. Bowel sounds are normal. There is no distension.     Palpations: Abdomen is soft.     Tenderness: There is no abdominal tenderness. There is no guarding or rebound.  Musculoskeletal:     Cervical back: Neck supple.     Right lower leg: No edema.     Left lower leg: No edema.  Skin:    General: Skin is warm and dry.     Coloration: Skin is not jaundiced or pale.  Neurological:     General: No focal deficit present.     Mental Status: He is alert and oriented to person, place, and time. Mental status is at baseline.  Psychiatric:        Mood and Affect: Mood normal.        Behavior: Behavior normal.        Thought Content: Thought content normal.        Judgment: Judgment normal.     Assessment:  Mr. Charles Simmons is a 40 y.o. male  who presents today for Esophagogastroduodenoscopy for dysphagia, h/o eoe.  Plan:  Esophagogastroduodenoscopy with possible intervention today  Esophagogastroduodenoscopy with possible biopsy, control of bleeding, polypectomy, and interventions as necessary has been discussed with the patient/patient representative. Informed consent was obtained from the patient/patient representative after explaining the indication, nature, and risks of the procedure including but not limited to death, bleeding, perforation, missed neoplasm/lesions, cardiorespiratory compromise, and reaction to medications. Opportunity for questions was given and appropriate answers were provided. Patient/patient representative has verbalized understanding is amenable to  undergoing the procedure.  Jaynie Collins, DO  [facility]  Portions of the record may have been created with voice recognition software. Occasional wrong-word or 'sound-a-like' substitutions may have occurred due to the inherent limitations of voice recognition software.  Read the chart carefully and recognize, using context, where substitutions may have occurred.

## 2020-12-18 NOTE — Anesthesia Postprocedure Evaluation (Signed)
Anesthesia Post Note  Patient: Charles Simmons  Procedure(s) Performed: ESOPHAGOGASTRODUODENOSCOPY (EGD) WITH PROPOFOL  Patient location during evaluation: PACU Anesthesia Type: General Level of consciousness: awake and oriented Pain management: satisfactory to patient Vital Signs Assessment: post-procedure vital signs reviewed and stable Respiratory status: spontaneous breathing and respiratory function stable Cardiovascular status: blood pressure returned to baseline Anesthetic complications: no   No notable events documented.   Last Vitals:  Vitals:   12/18/20 1304 12/18/20 1314  BP: 109/70 110/71  Pulse: (!) 55 (!) 59  Resp: 12 13  Temp:    SpO2: 97% 98%    Last Pain:  Vitals:   12/18/20 1314  TempSrc:   PainSc: 0-No pain                 VAN STAVEREN,Rowan Pollman

## 2020-12-18 NOTE — Interval H&P Note (Signed)
History and Physical Interval Note: Preprocedure H&P from 12/18/20  was reviewed and there was no interval change after seeing and examining the patient.  Written consent was obtained from the patient after discussion of risks, benefits, and alternatives. Patient has consented to proceed with Esophagogastroduodenoscopy with possible intervention   12/18/2020 12:31 PM  Charles Simmons  has presented today for surgery, with the diagnosis of esophageal dysphagia.  The various methods of treatment have been discussed with the patient and family. After consideration of risks, benefits and other options for treatment, the patient has consented to  Procedure(s): ESOPHAGOGASTRODUODENOSCOPY (EGD) WITH PROPOFOL (N/A) as a surgical intervention.  The patient's history has been reviewed, patient examined, no change in status, stable for surgery.  I have reviewed the patient's chart and labs.  Questions were answered to the patient's satisfaction.     Jaynie Collins

## 2020-12-18 NOTE — Transfer of Care (Signed)
Immediate Anesthesia Transfer of Care Note  Patient: Kelin Borum  Procedure(s) Performed: ESOPHAGOGASTRODUODENOSCOPY (EGD) WITH PROPOFOL  Patient Location: PACU and Endoscopy Unit  Anesthesia Type:General  Level of Consciousness: drowsy and patient cooperative  Airway & Oxygen Therapy: Patient Spontanous Breathing  Post-op Assessment: Report given to RN and Post -op Vital signs reviewed and stable  Post vital signs: Reviewed and stable  Last Vitals:  Vitals Value Taken Time  BP 117/74 12/18/20 1254  Temp    Pulse 65 12/18/20 1257  Resp 17 12/18/20 1257  SpO2 99 % 12/18/20 1257  Vitals shown include unvalidated device data.  Last Pain:  Vitals:   12/18/20 1254  TempSrc:   PainSc: 0-No pain         Complications: No notable events documented.

## 2020-12-18 NOTE — Anesthesia Procedure Notes (Signed)
Date/Time: 12/18/2020 12:38 PM Performed by: Loleta Pristine Gladhill, CRNA Pre-anesthesia Checklist: Patient identified, Emergency Drugs available, Suction available, Patient being monitored and Timeout performed Patient Re-evaluated:Patient Re-evaluated prior to induction Oxygen Delivery Method: Simple face mask Preoxygenation: Pre-oxygenation with 100% oxygen Induction Type: IV induction Airway Equipment and Method: Bite block Placement Confirmation: positive ETCO2 and CO2 detector

## 2020-12-18 NOTE — Anesthesia Preprocedure Evaluation (Signed)
Anesthesia Evaluation  Patient identified by MRN, date of birth, ID band Patient awake    Airway Mallampati: II       Dental  (+) Teeth Intact   Pulmonary neg pulmonary ROS, Current Smoker,    breath sounds clear to auscultation       Cardiovascular Exercise Tolerance: Good negative cardio ROS   Rhythm:Regular Rate:Normal     Neuro/Psych negative neurological ROS  negative psych ROS   GI/Hepatic Neg liver ROS, GERD  ,  Endo/Other  negative endocrine ROS  Renal/GU negative Renal ROS  negative genitourinary   Musculoskeletal  (+) Arthritis ,   Abdominal Normal abdominal exam  (+)   Peds negative pediatric ROS (+)  Hematology negative hematology ROS (+)   Anesthesia Other Findings   Reproductive/Obstetrics                             Anesthesia Physical Anesthesia Plan  ASA: 2  Anesthesia Plan: General   Post-op Pain Management:    Induction: Intravenous  PONV Risk Score and Plan:   Airway Management Planned: Nasal Cannula  Additional Equipment:   Intra-op Plan:   Post-operative Plan:   Informed Consent: I have reviewed the patients History and Physical, chart, labs and discussed the procedure including the risks, benefits and alternatives for the proposed anesthesia with the patient or authorized representative who has indicated his/her understanding and acceptance.     Dental Advisory Given  Plan Discussed with: CRNA and Surgeon  Anesthesia Plan Comments: (Patient consented for risks of anesthesia including but not limited to:  - adverse reactions to medications - risk of airway placement if required - damage to eyes, teeth, lips or other oral mucosa - nerve damage due to positioning  - sore throat or hoarseness - Damage to heart, brain, nerves, lungs, other parts of body or loss of life  Patient voiced understanding.)        Anesthesia Quick Evaluation

## 2020-12-19 LAB — SURGICAL PATHOLOGY

## 2020-12-23 ENCOUNTER — Ambulatory Visit: Payer: 59 | Attending: Internal Medicine

## 2020-12-23 ENCOUNTER — Other Ambulatory Visit: Payer: Self-pay

## 2020-12-23 DIAGNOSIS — Z23 Encounter for immunization: Secondary | ICD-10-CM

## 2020-12-23 MED ORDER — PFIZER COVID-19 VAC BIVALENT 30 MCG/0.3ML IM SUSP
INTRAMUSCULAR | 0 refills | Status: DC
  Filled 2020-12-23: qty 0.3, 1d supply, fill #0

## 2020-12-23 NOTE — Progress Notes (Signed)
   Covid-19 Vaccination Clinic  Name:  Antonyo Hinderer    MRN: [id number] DOB: 09-03-1980  12/23/2020  Mr. Germer was observed post Covid-19 immunization for 15 minutes without incident. He was provided with Vaccine Information Sheet and instruction to access the V-Safe system.   Mr. Deleo was instructed to call 911 with any severe reactions post vaccine: Difficulty breathing  Swelling of face and throat  A fast heartbeat  A bad rash all over body  Dizziness and weakness   Immunizations Administered     Name Date Dose VIS Date Route   Pfizer Covid-19 Vaccine Bivalent Booster 12/23/2020 11:44 AM 0.3 mL 10/22/2020 Intramuscular   Manufacturer: [employer], [employer]   Lot: [id number]   NDC: [address]-2      Drusilla Kanner, PharmD, MBA Clinical Acute Care Pharmacist

## 2020-12-24 ENCOUNTER — Other Ambulatory Visit: Payer: Self-pay

## 2020-12-25 ENCOUNTER — Other Ambulatory Visit: Payer: Self-pay

## 2020-12-25 MED ORDER — OMEPRAZOLE 40 MG PO CPDR
DELAYED_RELEASE_CAPSULE | ORAL | 3 refills | Status: DC
  Filled 2020-12-25: qty 180, 90d supply, fill #0
  Filled 2021-06-03: qty 180, 90d supply, fill #1
  Filled 2021-11-27: qty 180, 90d supply, fill #2

## 2021-01-05 ENCOUNTER — Other Ambulatory Visit: Payer: Self-pay

## 2021-01-08 ENCOUNTER — Telehealth: Payer: Self-pay | Admitting: Gastroenterology

## 2021-01-08 NOTE — Telephone Encounter (Signed)
Called the patient. No answer. Left him a voicemail about the esophageal manometry asking if he wants the date of 02/04/21 or if he has other arrangements. Will send a message through his patient portal of My Chart as well.

## 2021-01-12 ENCOUNTER — Other Ambulatory Visit: Payer: Self-pay

## 2021-01-13 NOTE — Telephone Encounter (Signed)
Patient accepts the appointment. Information, prep instructions through My Chart.

## 2021-01-23 ENCOUNTER — Encounter (HOSPITAL_COMMUNITY): Payer: Self-pay | Admitting: Gastroenterology

## 2021-02-04 ENCOUNTER — Ambulatory Visit (HOSPITAL_COMMUNITY)
Admission: RE | Admit: 2021-02-04 | Discharge: 2021-02-04 | Disposition: A | Discharge status: Home / Self Care | Payer: 59 | Attending: Gastroenterology | Admitting: Gastroenterology

## 2021-02-04 ENCOUNTER — Encounter (HOSPITAL_COMMUNITY)
Admission: RE | Disposition: A | Discharge status: Home / Self Care | Payer: Self-pay | Source: Home / Self Care | Attending: Gastroenterology

## 2021-02-04 DIAGNOSIS — K219 Gastro-esophageal reflux disease without esophagitis: Secondary | ICD-10-CM | POA: Diagnosis not present

## 2021-02-04 DIAGNOSIS — K224 Dyskinesia of esophagus: Secondary | ICD-10-CM | POA: Insufficient documentation

## 2021-02-04 DIAGNOSIS — K2100 Gastro-esophageal reflux disease with esophagitis, without bleeding: Secondary | ICD-10-CM

## 2021-02-04 DIAGNOSIS — R12 Heartburn: Secondary | ICD-10-CM | POA: Diagnosis not present

## 2021-02-04 HISTORY — PX: PH IMPEDANCE STUDY: SHX5565

## 2021-02-04 HISTORY — PX: ESOPHAGEAL MANOMETRY: SHX5429

## 2021-02-04 SURGERY — MANOMETRY, ESOPHAGUS
Anesthesia: Choice

## 2021-02-04 MED ORDER — LIDOCAINE VISCOUS HCL 2 % MT SOLN
15.0000 mL | Freq: Once | OROMUCOSAL | Status: DC
  Filled 2021-02-04: qty 15

## 2021-02-04 SURGICAL SUPPLY — 2 items
FACESHIELD LNG OPTICON STERILE (SAFETY) IMPLANT
GLOVE BIO SURGEON STRL SZ8 (GLOVE) ×4 IMPLANT

## 2021-02-04 NOTE — Progress Notes (Signed)
Esophageal Manometry done per protocol. Pt tolerated well with out complication. Ph with impedance done per protocol. Pt tolerated well. Instructions given regarding the study and monitor. Pt verbalized understand and return demonstrated use of monitor. Pt will return tomorrow to have probe removed and monitor downloaded.  

## 2021-02-05 ENCOUNTER — Encounter (HOSPITAL_COMMUNITY): Payer: Self-pay | Admitting: Gastroenterology

## 2021-03-11 ENCOUNTER — Other Ambulatory Visit: Payer: Self-pay | Admitting: Internal Medicine

## 2021-03-11 ENCOUNTER — Encounter: Payer: Self-pay | Admitting: Internal Medicine

## 2021-03-11 DIAGNOSIS — K219 Gastro-esophageal reflux disease without esophagitis: Secondary | ICD-10-CM

## 2021-03-13 ENCOUNTER — Other Ambulatory Visit: Payer: Self-pay

## 2021-03-13 MED ORDER — METOCLOPRAMIDE HCL 5 MG PO TABS
ORAL_TABLET | ORAL | 3 refills | Status: DC
  Filled 2021-03-13: qty 52, 26d supply, fill #0
  Filled 2021-03-13: qty 8, 4d supply, fill #0

## 2021-03-16 ENCOUNTER — Other Ambulatory Visit: Payer: Self-pay

## 2021-03-17 DIAGNOSIS — K2100 Gastro-esophageal reflux disease with esophagitis, without bleeding: Secondary | ICD-10-CM | POA: Diagnosis not present

## 2021-03-17 DIAGNOSIS — K224 Dyskinesia of esophagus: Secondary | ICD-10-CM | POA: Diagnosis not present

## 2021-03-18 ENCOUNTER — Other Ambulatory Visit: Payer: Self-pay

## 2021-03-18 MED ORDER — MOTEGRITY 1 MG PO TABS
ORAL_TABLET | ORAL | 1 refills | Status: DC
  Filled 2021-03-18 – 2021-03-25 (×3): qty 30, 30d supply, fill #0

## 2021-03-24 ENCOUNTER — Other Ambulatory Visit: Payer: Self-pay

## 2021-03-24 DIAGNOSIS — R12 Heartburn: Secondary | ICD-10-CM

## 2021-03-25 ENCOUNTER — Other Ambulatory Visit: Payer: Self-pay

## 2021-04-16 ENCOUNTER — Other Ambulatory Visit: Payer: Self-pay

## 2021-04-28 ENCOUNTER — Other Ambulatory Visit: Payer: Self-pay

## 2021-06-04 ENCOUNTER — Other Ambulatory Visit: Payer: Self-pay

## 2021-06-29 NOTE — Progress Notes (Signed)
?Terrilee Files D.O. ?[facility] ?[address] [place] [address] ?Phone: [phone] ?Subjective:   ?I, Nadine Counts, am serving as a Neurosurgeon for Dr. Antoine Primas. ?This visit occurred during the SARS-CoV-2 public health emergency.  Safety protocols were in place, including screening questions prior to the visit, additional usage of staff PPE, and extensive cleaning of exam room while observing appropriate contact time as indicated for disinfecting solutions.  ? ?I'm seeing this patient by the request  of:  Reubin Milan, MD ? ?CC: Left knee pain ? ?DXA:JOINOMVEHM  ?Newman Waren is a 41 y.o. male coming in with complaint of L knee/hamstring pain. Last seen on 07/23/2020 for L knee pain. 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 Over the past couple of months having problems. Feels just a little off. Weakness and instability. Wants to check in because has been going 5-6 days out the week. Recommendation for dietitian.  ? ? ?  ? ?Past Medical History:  ?Diagnosis Date  ? Allergic genetic state   ? 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 Dx'd 2012 - had some headaches but improved MRI 2019 - Normal, ethmoid sinusitis  ? Cholecystitis   ? Eosinophilic esophagitis   ? Eosinophilic esophagitis 02/25/2017  ? GERD (gastroesophageal reflux disease)   ? Renal infection   ? ?Past Surgical History:  ?Procedure Laterality Date  ? CHOLECYSTECTOMY  2006  ? ESOPHAGEAL MANOMETRY N/A 02/04/2021  ? Procedure: ESOPHAGEAL MANOMETRY (EM);  Surgeon: Napoleon Form, MD;  Location: [facility];  Service: Endoscopy;  Laterality: N/A;  ? ESOPHAGOGASTRODUODENOSCOPY    ? ESOPHAGOGASTRODUODENOSCOPY (EGD) WITH PROPOFOL N/A 02/22/2019  ? Procedure: ESOPHAGOGASTRODUODENOSCOPY (EGD) WITH PROPOFOL;  Surgeon: Toledo, Boykin Nearing, MD;  Location: [facility] ENDOSCOPY;  Service: Gastroenterology;  Laterality: N/A;  ? ESOPHAGOGASTRODUODENOSCOPY (EGD) WITH PROPOFOL N/A 01/15/2020  ? Procedure:  ESOPHAGOGASTRODUODENOSCOPY (EGD) WITH PROPOFOL;  Surgeon: Regis Bill, MD;  Location: [facility] ENDOSCOPY;  Service: Endoscopy;  Laterality: N/A;  ? ESOPHAGOGASTRODUODENOSCOPY (EGD) WITH PROPOFOL N/A 12/18/2020  ? Procedure: ESOPHAGOGASTRODUODENOSCOPY (EGD) WITH PROPOFOL;  Surgeon: Jaynie Collins, DO;  Location: [facility] ENDOSCOPY;  Service: Gastroenterology;  Laterality: N/A;  ? PH IMPEDANCE STUDY N/A 02/04/2021  ? Procedure: PH IMPEDANCE STUDY;  Surgeon: Napoleon Form, MD;  Location: [facility]COPY;  Service: Endoscopy;  Laterality: N/A;  ? ULNAR NERVE TRANSPOSITION Bilateral 2019  ? cubital tunnel release  ? ?Social History  ? ?Socioeconomic History  ? Marital status: Married  ?  Spouse name: Bartholomew Boards  ? Number of children: 0  ? Years of education: Not on file  ? Highest education level: Not on file  ?Occupational History  ? Not on file  ?Tobacco Use  ? Smoking status: Some Days  ?  Types: Cigars  ? Smokeless tobacco: Never  ?Vaping Use  ? Vaping Use: Never used  ?Substance and Sexual Activity  ? Alcohol use: Yes  ?  Alcohol/week: 5.0 - 7.0 standard drinks  ?  Types: 5 - 7 Cans of beer per week  ? Drug use: No  ? Sexual activity: Yes  ?  Partners: Female  ?Other Topics Concern  ? Not on file  ?Social History Narrative  ? Not on file  ? ?Social Determinants of Health  ? ?Financial Resource Strain: Not on file  ?Food Insecurity: Not on file  ?Transportation Needs: Not on file  ?Physical Activity: Not on file  ?Stress: Not on file  ?Social Connections: Not on file  ? ?Allergies  ?Allergen  Reactions  ? Gadolinium Derivatives Hives  ? Ivp Dye [Iodinated Contrast Media]   ? ?Family History  ?Problem Relation Age of Onset  ? Diabetes Mother   ? Crohn's disease Father 61  ? Colon polyps Father   ? Cancer Maternal Grandmother   ? Colon cancer Maternal Grandmother   ? ? ? ? ? ? ? ?Current Outpatient Medications (Other):  ?  Vitamin D, Ergocalciferol, (DRISDOL) 1.25 MG (50000 UNIT) CAPS capsule, Take 1 capsule  (50,000 Units total) by mouth every 7 (seven) days. ?  Cholecalciferol (VITAMIN D3 PO), Take by mouth daily. ?  fluticasone (CUTIVATE) 0.005 % ointment, 1 spray nasal   2 times a day Until directed to stop ?  metoCLOPramide (REGLAN) 5 MG tablet, Take 1 tablet (5 mg total) by mouth 2 (two) times daily before breakfast and lunch for 10 days ?  omeprazole (PRILOSEC) 40 MG capsule, TAKE 1 CAPSULE BY MOUTH TWICE DAILY BEFORE MEALS. TAKE 15-20 MINUTES BEFORE MEALS. ?  omeprazole (PRILOSEC) 40 MG capsule, Take 1 capsule (40 mg total) by mouth 2 (two) times daily before meals ?  Prucalopride Succinate (MOTEGRITY) 1 MG TABS, Take 1 tablet (1 mg total) by mouth once daily ? ? ?Reviewed prior external information including notes and imaging from  ?primary care provider ?As well as notes that were available from care everywhere and other healthcare systems. ? ?Past medical history, social, surgical and family history all reviewed in electronic medical record.  No pertanent information unless stated regarding to the chief complaint.  ? ?Review of Systems: ? No headache, visual changes, nausea, vomiting, diarrhea, constipation, dizziness, abdominal pain, skin rash, fevers, chills, night sweats, weight loss, swollen lymph nodes, body aches, joint swelling, chest pain, shortness of breath, mood changes. POSITIVE muscle aches ? ?Objective  ?Blood pressure 122/84, pulse 70, height 5\' 10"  (1.778 m), weight 173 lb (78.5 kg), SpO2 99 %. ?  ?General: No apparent distress alert and oriented x3 mood and affect normal, dressed appropriately.  ?HEENT: Pupils equal, extraocular movements intact  ?Respiratory: Patient's speak in full sentences and does not appear short of breath  ?Cardiovascular: No lower extremity edema, non tender, no erythema  ?Gait normal with good balance and coordination.  ?MSK: Left knee exam does have tender to palpation proximal to the patella.  Does have tenderness to palpation in that area.  Full range of motion  otherwise noted.  Good strength noted.  Minimal tenderness noted over the Pez anserine and medial tibial spine approximately.  Negative McMurray's ? ?Limited muscular skeletal ultrasound was performed and interpreted by , M  ?Limited ultrasound of patient's knee still shows calcific changes noted ?Seems to be more of a bone spur of the patella as well as some calcific changes of the patella tendon.  Significant effusion noted of the knee itself.  Meniscus appear to be unremarkable.  We will have changes over the proximal tibial area. 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 ? ?  ?Impression and Recommendations:  ?  ? ?The above documentation has been reviewed and is accurate and complete Antoine Primas, DO ? ? ? ?

## 2021-07-01 ENCOUNTER — Ambulatory Visit (INDEPENDENT_AMBULATORY_CARE_PROVIDER_SITE_OTHER): Payer: 59 | Admitting: Family Medicine

## 2021-07-01 ENCOUNTER — Ambulatory Visit: Payer: Self-pay

## 2021-07-01 ENCOUNTER — Other Ambulatory Visit: Payer: Self-pay

## 2021-07-01 VITALS — BP 122/84 | HR 70 | Ht 70.0 in | Wt 173.0 lb

## 2021-07-01 DIAGNOSIS — M65262 Calcific tendinitis, left lower leg: Secondary | ICD-10-CM | POA: Insufficient documentation

## 2021-07-01 DIAGNOSIS — M25562 Pain in left knee: Secondary | ICD-10-CM | POA: Diagnosis not present

## 2021-07-01 MED ORDER — VITAMIN D (ERGOCALCIFEROL) 1.25 MG (50000 UNIT) PO CAPS
50000.0000 [IU] | ORAL_CAPSULE | ORAL | 0 refills | Status: DC
  Filled 2021-07-01: qty 12, 84d supply, fill #0

## 2021-07-01 NOTE — Patient Instructions (Signed)
Soundwave therapy ?Vit D Prescribed ?Do prescribed exercises at least 3x a week ?Will get back to you about nutritionist ?Avoid high box jumps and lunges ?See you again in 4-6 weeks ? ?

## 2021-07-01 NOTE — Assessment & Plan Note (Signed)
Patient does have calcific changes noted again.  At this point I do think the patient could be a candidate for the shockwave therapy.  Patient will be set up for this and hopefully this will be beneficial.  In addition to this put patient back on a once weekly vitamin D and hope this will be helpful as well.  We discussed avoiding deep squats and certainly advanced flexion of the knee or quick fiber movements.  Patient will follow-up with me again in 6 weeks otherwise. ?

## 2021-07-07 ENCOUNTER — Encounter: Payer: 59 | Admitting: Internal Medicine

## 2021-07-08 ENCOUNTER — Encounter: Payer: Self-pay | Admitting: Internal Medicine

## 2021-07-08 ENCOUNTER — Ambulatory Visit (INDEPENDENT_AMBULATORY_CARE_PROVIDER_SITE_OTHER): Payer: 59 | Admitting: Internal Medicine

## 2021-07-08 VITALS — BP 102/74 | HR 70 | Ht 70.0 in | Wt 173.0 lb

## 2021-07-08 DIAGNOSIS — K219 Gastro-esophageal reflux disease without esophagitis: Secondary | ICD-10-CM | POA: Diagnosis not present

## 2021-07-08 DIAGNOSIS — K5904 Chronic idiopathic constipation: Secondary | ICD-10-CM

## 2021-07-08 DIAGNOSIS — Z0000 Encounter for general adult medical examination without abnormal findings: Secondary | ICD-10-CM | POA: Diagnosis not present

## 2021-07-08 DIAGNOSIS — E785 Hyperlipidemia, unspecified: Secondary | ICD-10-CM

## 2021-07-08 NOTE — Progress Notes (Signed)
? ? ?Date:  07/08/2021  ? ?Name:  Charles Simmons   DOB:  Mar 22, 1980   MRN:  [id number] ? ? ?Chief Complaint: Annual Exam ?Charles Simmons is a 41 y.o. male who presents today for his Complete Annual Exam. He feels well. He reports exercising/ training for iron man triathlon. He reports he is sleeping fairly well.  ? ?Colonoscopy: not due ? ?Immunization History  ?Administered Date(s) Administered  ? Influenza, High Dose Seasonal PF 12/03/2014  ? Influenza,inj,Quad PF,6+ Mos 02/25/2017, 01/27/2018  ? PFIZER Comirnaty(Gray Top)Covid-19 Tri-Sucrose Vaccine 02/19/2019, 03/09/2019, 12/07/2019  ? Pension scheme manager 27yr & up 12/23/2020  ? Tdap 12/28/2014  ? ?Health Maintenance Due  ?Topic Date Due  ? Pneumococcal Vaccine 171659Years old (1 - PCV) Never done  ? HIV Screening  Never done  ?  ?No results found for: PSA1, PSA ? ? ?Gastroesophageal Reflux ?He complains of choking, dysphagia and globus sensation. He reports no abdominal pain, no chest pain, no sore throat or no wheezing. This is a recurrent problem. The problem occurs frequently. The problem has been unchanged. Pertinent negatives include no fatigue. He has tried a PPI for the symptoms. Past procedures include an EGD and esophageal manometry. [facility]  ? ?Lab Results  ?Component Value Date  ? NA 139 06/30/2020  ? K 4.3 06/30/2020  ? CO2 24 06/30/2020  ? GLUCOSE 91 06/30/2020  ? BUN 13 06/30/2020  ? CREATININE 1.02 06/30/2020  ? CALCIUM 9.9 06/30/2020  ? EGFR 96 06/30/2020  ? GFRNONAA 95 08/31/2018  ? ?Lab Results  ?Component Value Date  ? CHOL 184 06/30/2020  ? HDL 51 06/30/2020  ? LDLCALC 119 (H) 06/30/2020  ? TRIG 73 06/30/2020  ? CHOLHDL 3.6 06/30/2020  ? ?No results found for: TSH ?No results found for: HGBA1C ?Lab Results  ?Component Value Date  ? WBC 5.1 06/30/2020  ? HGB 15.1 06/30/2020  ? HCT 43.6 06/30/2020  ? MCV 98 (H) 06/30/2020  ? PLT 262 06/30/2020  ? ?Lab Results  ?Component Value Date  ? ALT 18  06/30/2020  ? AST 17 06/30/2020  ? ALKPHOS 92 06/30/2020  ? BILITOT 1.5 (H) 06/30/2020  ? ?No results found for: 25OHVITD2, 2[place] VD25OH  ? ?Review of Systems  ?Constitutional:  Negative for appetite change, chills, diaphoresis, fatigue and unexpected weight change.  ?HENT:  Positive for trouble swallowing. Negative for hearing loss, sore throat, tinnitus and voice change.   ?Eyes:  Negative for visual disturbance.  ?Respiratory:  Positive for choking. Negative for shortness of breath and wheezing.   ?Cardiovascular:  Negative for chest pain, palpitations and leg swelling.  ?Gastrointestinal:  Positive for dysphagia. Negative for abdominal pain, blood in stool, constipation and diarrhea.  ?Genitourinary:  Negative for difficulty urinating, dysuria and frequency.  ?Musculoskeletal:  Negative for arthralgias, back pain and myalgias.  ?Skin:  Negative for color change and rash.  ?Neurological:  Negative for dizziness, syncope and headaches.  ?Hematological:  Negative for adenopathy.  ?Psychiatric/Behavioral:  Negative for dysphoric mood and sleep disturbance. The patient is not nervous/anxious.   ? ?Patient Active Problem List  ? Diagnosis Date Noted  ? Chronic idiopathic constipation 07/08/2021  ? Calcific tendonitis of left lower leg 07/01/2021  ? Heartburn   ? Gastroesophageal reflux disease 09/12/2020  ? Hamstring tightness of left lower extremity 06/04/2020  ? Pes anserinus bursitis of right knee 05/09/2020  ? Left medial tibial stress syndrome 04/01/2020  ? Chronic sinusitis, unspecified 08/31/2018  ? ? ?  Allergies  ?Allergen Reactions  ? Gadolinium Derivatives Hives  ? Ivp Dye [Iodinated Contrast Media]   ? ? ?Past Surgical History:  ?Procedure Laterality Date  ? CHOLECYSTECTOMY  2006  ? ESOPHAGEAL MANOMETRY N/A 02/04/2021  ? Procedure: ESOPHAGEAL MANOMETRY (EM);  Surgeon: Mauri Pole, MD;  [facility]: [facility];  Service: Endoscopy;  Laterality: N/A;  ? ESOPHAGOGASTRODUODENOSCOPY    ?  ESOPHAGOGASTRODUODENOSCOPY (EGD) WITH PROPOFOL N/A 02/22/2019  ? Procedure: ESOPHAGOGASTRODUODENOSCOPY (EGD) WITH PROPOFOL;  Surgeon: Toledo, Benay Pike, MD;  Location: [facility] ENDOSCOPY;  Service: Gastroenterology;  Laterality: N/A;  ? ESOPHAGOGASTRODUODENOSCOPY (EGD) WITH PROPOFOL N/A 01/15/2020  ? Procedure: ESOPHAGOGASTRODUODENOSCOPY (EGD) WITH PROPOFOL;  Surgeon: Lesly Rubenstein, MD;  Location: [facility] ENDOSCOPY;  Service: Endoscopy;  Laterality: N/A;  ? ESOPHAGOGASTRODUODENOSCOPY (EGD) WITH PROPOFOL N/A 12/18/2020  ? Procedure: ESOPHAGOGASTRODUODENOSCOPY (EGD) WITH PROPOFOL;  Surgeon: Annamaria Helling, DO;  Location: [facility] ENDOSCOPY;  Service: Gastroenterology;  Laterality: N/A;  ? [place] IMPEDANCE STUDY N/A 02/04/2021  ? Procedure: [place] IMPEDANCE STUDY;  Surgeon: Mauri Pole, MD;  Location: [facility];  Service: Endoscopy;  Laterality: N/A;  ? ULNAR NERVE TRANSPOSITION Bilateral 2019  ? cubital tunnel release  ? ? ?Social History  ? ?Tobacco Use  ? Smoking status: Some Days  ?  Types: Cigars  ? Smokeless tobacco: Never  ?Vaping Use  ? Vaping Use: Never used  ?Substance Use Topics  ? Alcohol use: Yes  ?  Alcohol/week: 5.0 - 7.0 standard drinks  ?  Types: 5 - 7 Cans of beer per week  ? Drug use: No  ? ? ? ?Medication list has been reviewed and updated. ? ?Current Meds  ?Medication Sig  ? omeprazole (PRILOSEC) 40 MG capsule Take 1 capsule (40 mg total) by mouth 2 (two) times daily before meals  ? Vitamin D, Ergocalciferol, (DRISDOL) 1.25 MG (50000 UNIT) CAPS capsule Take 1 capsule (50,000 Units total) by mouth every 7 (seven) days.  ? ? ? ?  07/08/2021  ?  8:03 AM 06/30/2020  ?  8:50 AM  ?GAD 7 : Generalized Anxiety Score  ?Nervous, Anxious, on Edge 0 0  ?Control/stop worrying 0 0  ?Worry too much - different things 0 0  ?Trouble relaxing 0 0  ?Restless 0 0  ?Easily annoyed or irritable 0 0  ?Afraid - awful might happen 0 0  ?Total GAD 7 Score 0 0  ?Anxiety Difficulty Not difficult at all Not difficult at all   ? ? ? ?  07/08/2021  ?  8:03 AM  ?Depression screen PHQ 2/9  ?Decreased Interest 0  ?Down, Depressed, Hopeless 0  ?PHQ - 2 Score 0  ?Altered sleeping 0  ?Tired, decreased energy 0  ?Change in appetite 0  ?Feeling bad or failure about yourself  0  ?Trouble concentrating 0  ?Moving slowly or fidgety/restless 0  ?Suicidal thoughts 0  ?PHQ-9 Score 0  ?Difficult doing work/chores Not difficult at all  ? ? ?BP Readings from Last 3 Encounters:  ?07/08/21 102/74  ?07/01/21 122/84  ?12/18/20 110/71  ? ? ?Physical Exam ?Vitals and nursing note reviewed.  ?Constitutional:   ?   Appearance: Normal appearance. He is well-developed.  ?HENT:  ?   Head: Normocephalic.  ?   Right Ear: Tympanic membrane, ear canal and external ear normal.  ?   Left Ear: Tympanic membrane, ear canal and external ear normal.  ?   Nose: Nose normal.  ?Eyes:  ?   Conjunctiva/sclera: Conjunctivae normal.  ?   Pupils: Pupils  are equal, round, and reactive to light.  ?Neck:  ?   Thyroid: No thyromegaly.  ?   Vascular: No carotid bruit.  ?Cardiovascular:  ?   Rate and Rhythm: Regular rhythm.  ?   Heart sounds: Normal heart sounds.  ?Pulmonary:  ?   Effort: Pulmonary effort is normal.  ?   Breath sounds: Normal breath sounds. No wheezing.  ?Chest:  ?Breasts: ?   Right: No mass.  ?   Left: No mass.  ?Abdominal:  ?   General: Bowel sounds are normal.  ?   Palpations: Abdomen is soft.  ?   Tenderness: There is no abdominal tenderness.  ?Musculoskeletal:     ?   General: Normal range of motion.  ?   Cervical back: Normal range of motion and neck supple.  ?   Right lower leg: No edema.  ?   Left lower leg: No edema.  ?Lymphadenopathy:  ?   Cervical: No cervical adenopathy.  ?Skin: ?   General: Skin is warm and dry.  ?   Capillary Refill: Capillary refill takes less than 2 seconds.  ?Neurological:  ?   General: No focal deficit present.  ?   Mental Status: He is alert and oriented to person, place, and time.  ?   Deep Tendon Reflexes: Reflexes are normal and  symmetric.  ?Psychiatric:     ?   Attention and Perception: Attention normal.     ?   Mood and Affect: Mood normal.     ?   Thought Content: Thought content normal.  ? ? ?Wt Readings from Last 3 Encounters:  ?07/08/21

## 2021-07-09 LAB — CBC WITH DIFFERENTIAL/PLATELET
Basophils Absolute: 0 10*3/uL (ref 0.0–0.2)
Basos: 0 %
EOS (ABSOLUTE): 0.2 10*3/uL (ref 0.0–0.4)
Eos: 3 %
Hematocrit: 44.1 % (ref 37.5–51.0)
Hemoglobin: 15 g/dL (ref 13.0–17.7)
Immature Grans (Abs): 0 10*3/uL (ref 0.0–0.1)
Immature Granulocytes: 0 %
Lymphocytes Absolute: 1.5 10*3/uL (ref 0.7–3.1)
Lymphs: 31 %
MCH: 33.2 pg — ABNORMAL HIGH (ref 26.6–33.0)
MCHC: 34 g/dL (ref 31.5–35.7)
MCV: 98 fL — ABNORMAL HIGH (ref 79–97)
Monocytes Absolute: 0.4 10*3/uL (ref 0.1–0.9)
Monocytes: 8 %
Neutrophils Absolute: 2.7 10*3/uL (ref 1.4–7.0)
Neutrophils: 58 %
Platelets: 265 10*3/uL (ref 150–450)
RBC: 4.52 x10E6/uL (ref 4.14–5.80)
RDW: 12.2 % (ref 11.6–15.4)
WBC: 4.7 10*3/uL (ref 3.4–10.8)

## 2021-07-09 LAB — COMPREHENSIVE METABOLIC PANEL
ALT: 19 IU/L (ref 0–44)
AST: 19 IU/L (ref 0–40)
Albumin/Globulin Ratio: 1.7 (ref 1.2–2.2)
Albumin: 4.7 g/dL (ref 4.0–5.0)
Alkaline Phosphatase: 89 IU/L (ref 44–121)
BUN/Creatinine Ratio: 10 (ref 9–20)
BUN: 11 mg/dL (ref 6–20)
Bilirubin Total: 1.6 mg/dL — ABNORMAL HIGH (ref 0.0–1.2)
CO2: 24 mmol/L (ref 20–29)
Calcium: 9.4 mg/dL (ref 8.7–10.2)
Chloride: 100 mmol/L (ref 96–106)
Creatinine, Ser: 1.05 mg/dL (ref 0.76–1.27)
Globulin, Total: 2.7 g/dL (ref 1.5–4.5)
Glucose: 84 mg/dL (ref 70–99)
Potassium: 4.3 mmol/L (ref 3.5–5.2)
Sodium: 139 mmol/L (ref 134–144)
Total Protein: 7.4 g/dL (ref 6.0–8.5)
eGFR: 93 mL/min/{1.73_m2} (ref >59)

## 2021-07-09 LAB — LIPID PANEL
Chol/HDL Ratio: 3.3 ratio (ref 0.0–5.0)
Cholesterol, Total: 156 mg/dL (ref 100–199)
HDL: 48 mg/dL (ref >39)
LDL Chol Calc (NIH): 91 mg/dL (ref 0–99)
Triglycerides: 88 mg/dL (ref 0–149)
VLDL Cholesterol Cal: 17 mg/dL (ref 5–40)

## 2021-07-09 LAB — TSH: TSH: 1.48 u[IU]/mL (ref 0.450–4.500)

## 2021-07-10 ENCOUNTER — Ambulatory Visit: Payer: 59 | Admitting: Family Medicine

## 2021-07-12 ENCOUNTER — Encounter: Payer: Self-pay | Admitting: Family Medicine

## 2021-07-16 NOTE — Telephone Encounter (Signed)
Scheduled for 6/5

## 2021-07-27 ENCOUNTER — Ambulatory Visit (INDEPENDENT_AMBULATORY_CARE_PROVIDER_SITE_OTHER): Payer: Self-pay | Admitting: Family Medicine

## 2021-07-27 DIAGNOSIS — M65262 Calcific tendinitis, left lower leg: Secondary | ICD-10-CM

## 2021-07-27 NOTE — Patient Instructions (Signed)
Good to see you today.  Please schedule a follow-up appt for next week for your 2nd shockwave therapy session.

## 2021-07-27 NOTE — Progress Notes (Signed)
   [facility] [address] [place] [address] Phone: [phone]   Extracorporeal Shockwave Therapy Note    Patient is being treated today with ECSWT. Informed consent was obtained and patient tolerated procedure well.   Therapy performed by Clementeen Graham  Condition treated: Chronic calcific quad tendinitis Treatment preset used: Patellar tendinosis Energy used: 90 mJ Frequency used: 10 Hz Number of pulses: 2000 Treatment #1 of #4  Electronically signed by:  [facility] 12:54 PM 07/27/21

## 2021-08-05 ENCOUNTER — Ambulatory Visit (INDEPENDENT_AMBULATORY_CARE_PROVIDER_SITE_OTHER): Payer: Self-pay | Admitting: Family Medicine

## 2021-08-05 DIAGNOSIS — M65262 Calcific tendinitis, left lower leg: Secondary | ICD-10-CM

## 2021-08-05 NOTE — Progress Notes (Signed)
   [facility] [address] [place] [address] Phone: [phone]   Extracorporeal Shockwave Therapy Note    Patient is being treated today with ECSWT. Informed consent was obtained and patient tolerated procedure well.   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

## 2021-08-07 ENCOUNTER — Other Ambulatory Visit: Payer: Self-pay

## 2021-08-07 DIAGNOSIS — K224 Dyskinesia of esophagus: Secondary | ICD-10-CM | POA: Diagnosis not present

## 2021-08-07 DIAGNOSIS — K219 Gastro-esophageal reflux disease without esophagitis: Secondary | ICD-10-CM | POA: Diagnosis not present

## 2021-08-07 DIAGNOSIS — K30 Functional dyspepsia: Secondary | ICD-10-CM | POA: Diagnosis not present

## 2021-08-07 DIAGNOSIS — R1319 Other dysphagia: Secondary | ICD-10-CM | POA: Diagnosis not present

## 2021-08-07 DIAGNOSIS — K200 Eosinophilic esophagitis: Secondary | ICD-10-CM | POA: Diagnosis not present

## 2021-08-07 MED ORDER — MOTEGRITY 2 MG PO TABS
1.0000 | ORAL_TABLET | Freq: Every day | ORAL | 3 refills | Status: DC
  Filled 2021-08-07: qty 90, 90d supply, fill #0
  Filled 2021-08-18: qty 30, 30d supply, fill #0
  Filled 2022-02-01: qty 90, 90d supply, fill #0
  Filled 2022-05-09 – 2022-05-31 (×2): qty 90, 90d supply, fill #1

## 2021-08-18 ENCOUNTER — Other Ambulatory Visit: Payer: Self-pay

## 2021-08-19 ENCOUNTER — Ambulatory Visit (INDEPENDENT_AMBULATORY_CARE_PROVIDER_SITE_OTHER): Payer: Self-pay | Admitting: Family Medicine

## 2021-08-19 DIAGNOSIS — M65262 Calcific tendinitis, left lower leg: Secondary | ICD-10-CM

## 2021-08-19 NOTE — Progress Notes (Signed)
   [facility] [address] [place] [address] Phone: [phone]   Extracorporeal Shockwave Therapy Note    Patient is being treated today with ECSWT. Informed consent was obtained and patient tolerated procedure well.   Therapy performed by Clementeen Graham  Condition treated: Quadricep tendinitis Treatment preset used: Patellar tendinitis Energy used: 90 mJ Frequency used: 10 Hz Number of pulses: 2000 Treatment #3 of #4  He is feeling a lot better so we discussed that it is okay to hold off on the next treatment.  He will let me know if we should proceed to the fourth shockwave treatment or not in the future.  Electronically signed by:  [facility] 4:38 PM 08/19/21

## 2021-08-20 ENCOUNTER — Other Ambulatory Visit: Payer: Self-pay

## 2021-09-08 ENCOUNTER — Other Ambulatory Visit: Payer: Self-pay

## 2021-11-24 NOTE — Progress Notes (Unsigned)
[facility] [place] Phone: [phone] Subjective:   Fontaine No, am serving as a scribe for Dr. Hulan Saas.  I'm seeing this patient by the request  of:  Glean Hess, MD  CC: Calcific tendinitis follow-up  [id number]  07/01/2021 Patient does have calcific changes noted again.  At this point I do think the patient could be a candidate for the shockwave therapy.  Patient will be set up for this and hopefully this will be beneficial.  In addition to this put patient back on a once weekly vitamin D and hope this will be helpful as well.  We discussed avoiding deep squats and certainly advanced flexion of the knee or quick fiber movements.  Patient will follow-up with me again in 6 weeks otherwise.  Update 11/25/2021 Miron Letts Biondo is a 41 y.o. male coming in with complaint of L knee pain. Patient did have multiple shockwave tx from Dr. Georgina Snell. Patient states that he fell on his knee 2 weeks ago landing on knee. Was able to ride 100 miles on bike and ran 18 miles the following day. Rode again this past weekend for 70 miles and had more pain. Ran 7 miles last night without pain. Pain over superior medial patella. Pain with stretching quad.      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   Dx'd 2012 - had some headaches but improved MRI 2019 - Normal, ethmoid sinusitis   Cholecystitis    Eosinophilic esophagitis    Eosinophilic esophagitis [id number]   GERD (gastroesophageal reflux disease)    Renal infection    Past Surgical History:  Procedure Laterality Date   CHOLECYSTECTOMY  2006   ESOPHAGEAL MANOMETRY N/A 02/04/2021   Procedure: ESOPHAGEAL MANOMETRY (EM);  Surgeon: Mauri Pole, MD;  [facility];  Service: Endoscopy;  Laterality: N/A;   ESOPHAGOGASTRODUODENOSCOPY     ESOPHAGOGASTRODUODENOSCOPY (EGD) WITH  PROPOFOL N/A 02/22/2019   Procedure: ESOPHAGOGASTRODUODENOSCOPY (EGD) WITH PROPOFOL;  Surgeon: Toledo, Benay Pike, MD;  Location: [facility] ENDOSCOPY;  Service: Gastroenterology;  Laterality: N/A;   ESOPHAGOGASTRODUODENOSCOPY (EGD) WITH PROPOFOL N/A 01/15/2020   Procedure: ESOPHAGOGASTRODUODENOSCOPY (EGD) WITH PROPOFOL;  Surgeon: Lesly Rubenstein, MD;  Location: [facility] ENDOSCOPY;  Service: Endoscopy;  Laterality: N/A;   ESOPHAGOGASTRODUODENOSCOPY (EGD) WITH PROPOFOL N/A 12/18/2020   Procedure: ESOPHAGOGASTRODUODENOSCOPY (EGD) WITH PROPOFOL;  Surgeon: Annamaria Helling, DO;  Location[facility] ENDOSCOPY;  Service: Gastroenterology;  Laterality: N/A;   [place] IMPEDANCE STUDY N/A 02/04/2021   Procedure: [place] IMPEDANCE STUDY;  Surgeon: Mauri Pole, MD;  [facility];  Service: Endoscopy;  Laterality: N/A;   ULNAR NERVE TRANSPOSITION Bilateral 2019   cubital tunnel release   Social History   Socioeconomic History   Marital status: Married    Spouse name: Eduard Roux   Number of children: 0   Years of education: Not on file   Highest education level: Not on file  Occupational History   Not on file  Tobacco Use   Smoking status: Former    Types: Cigars   Smokeless tobacco: Former  Scientific laboratory technician Use: Never used  Substance and Sexual Activity   Alcohol use: Yes    Alcohol/week: 5.0 - 7.0 standard drinks of alcohol    Types: 5 - 7 Cans of beer per week   Drug use: No   Sexual activity: Yes    Partners:  Female  Other Topics Concern   Not on file  Social History Narrative   Not on file   Social Determinants of Health   Financial Resource Strain: Not on file  Food Insecurity: Not on file  Transportation Needs: Not on file  Physical Activity: Not on file  Stress: Not on file  Social Connections: Not on file   Allergies  Allergen Reactions   Gadolinium Derivatives Hives   Ivp Dye [Iodinated Contrast Media]    Family History  Problem Relation Age of Onset   Diabetes  Mother    Crohn's disease Father 77   Colon polyps Father    Cancer Maternal Grandmother    Colon cancer Maternal Grandmother          Current Outpatient Medications (Other):    omeprazole (PRILOSEC) 40 MG capsule, Take 1 capsule (40 mg total) by mouth 2 (two) times daily before meals   Prucalopride Succinate (MOTEGRITY) 2 MG TABS, Take 1 tablet (2 mg total) by mouth daily.   Vitamin D, Ergocalciferol, (DRISDOL) 1.25 MG (50000 UNIT) CAPS capsule, Take 1 capsule (50,000 Units total) by mouth every 7 (seven) days.   Reviewed prior external information including notes and imaging from  primary care provider As well as notes that were available from care everywhere and other healthcare systems.  Past medical history, social, surgical and family history all reviewed in electronic medical record.  No pertanent information unless stated regarding to the chief complaint.   Review of Systems:  No headache, visual changes, nausea, vomiting, diarrhea, constipation, dizziness, abdominal pain, skin rash, fevers, chills, night sweats, weight loss, swollen lymph nodes, body aches, joint swelling, chest pain, shortness of breath, mood changes. POSITIVE muscle aches  Objective  Blood pressure (!) 120/92, pulse 77, height 5\' 10"  (1.778 m), weight 171 lb (77.6 kg), SpO2 99 %.   General: No apparent distress alert and oriented x3 mood and affect normal, dressed appropriately.  HEENT: Pupils equal, extraocular movements intact  Respiratory: Patient's speak in full sentences and does not appear short of breath  Cardiovascular: No lower extremity edema, non tender, no erythema  Left patella does have some tenderness noted in the quadricep area.  Patient does have full range of motion noted.  Patient does have full strength.  Improvement in VMO strengthening  Limited muscular skeletal ultrasound was performed and interpreted by Hulan Saas, M  Limited ultrasound shows the patient does have some  calcific changes still noted of the quadricep tendon but improved.  Patient does have what appears to be an acute avulsion of one of the spurring noted. Impression: Small improvement in the calcific tendinitis but patient does have some avulsion of the bone spur noted on the proximal patella.    Impression and Recommendations:    The above documentation has been reviewed and is accurate and complete Lyndal Pulley, DO

## 2021-11-25 ENCOUNTER — Ambulatory Visit (INDEPENDENT_AMBULATORY_CARE_PROVIDER_SITE_OTHER): Payer: 59 | Admitting: Family Medicine

## 2021-11-25 ENCOUNTER — Encounter: Payer: Self-pay | Admitting: Family Medicine

## 2021-11-25 ENCOUNTER — Other Ambulatory Visit: Payer: Self-pay

## 2021-11-25 ENCOUNTER — Ambulatory Visit: Payer: Self-pay

## 2021-11-25 VITALS — BP 120/92 | HR 77 | Ht 70.0 in | Wt 171.0 lb

## 2021-11-25 DIAGNOSIS — M25562 Pain in left knee: Secondary | ICD-10-CM | POA: Diagnosis not present

## 2021-11-25 DIAGNOSIS — M65262 Calcific tendinitis, left lower leg: Secondary | ICD-10-CM | POA: Diagnosis not present

## 2021-11-25 MED ORDER — VITAMIN D (ERGOCALCIFEROL) 1.25 MG (50000 UNIT) PO CAPS
50000.0000 [IU] | ORAL_CAPSULE | ORAL | 0 refills | Status: DC
  Filled 2021-11-25: qty 12, 84d supply, fill #0

## 2021-11-25 NOTE — Assessment & Plan Note (Signed)
Patient had what appeared to be a small avulsion actually noted of the spurring of the calcific area noted on the patella previously.  Hopefully this actually is not going to keep him from running.  We discussed the vitamin D for another 4 weeks.  Discussed with patient about potentially icing afterwards as well as some mild manipulation in the area.  Follow-up with me again in 4 to 6 weeks

## 2021-11-25 NOTE — Patient Instructions (Signed)
Ice  Voltaren  Do Vit D for next 4 weeks See you after the race Good Luck!

## 2021-11-27 ENCOUNTER — Other Ambulatory Visit: Payer: Self-pay

## 2021-12-14 NOTE — Progress Notes (Signed)
[facility] [address] [place] Phone: [phone] Subjective:   IVilma Meckel, am serving as a scribe for Dr. Hulan Saas.  I'm seeing this patient by the request  of:  Glean Hess, MD  CC: Knee pain  RSW:[id number]  11/25/2021 Patient had what appeared to be a small avulsion actually noted of the spurring of the calcific area noted on the patella previously.  Hopefully this actually is not going to keep him from running.  We discussed the vitamin D for another 4 weeks.  Discussed with patient about potentially icing afterwards as well as some mild manipulation in the area.  Follow-up with me again in 4 to 6 weeks  Updated 12/17/2021 Hiep Ollis is a 41 y.o. male coming in with complaint of knee and calf pain. Did an ironman on Sunday. Left knee was swollen during race, but was able to manage it after. Right knee starting to have sharp pain medial side. No other issues. Patient states that MAB had this before the race but did not tell us about it because he did not want to miss the race or have it affect anything.  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   Dx'd 2012 - had some headaches but improved MRI 2019 - Normal, ethmoid sinusitis   Cholecystitis    Eosinophilic esophagitis    Eosinophilic esophagitis 93/81/8299   GERD (gastroesophageal reflux disease)    Renal infection    Past Surgical History:  Procedure Laterality Date   CHOLECYSTECTOMY  2006   ESOPHAGEAL MANOMETRY N/A 02/04/2021   Procedure: ESOPHAGEAL MANOMETRY (EM);  Surgeon: Mauri Pole, MD;  [facility];  Service: Endoscopy;  Laterality: N/A;   ESOPHAGOGASTRODUODENOSCOPY     ESOPHAGOGASTRODUODENOSCOPY (EGD) WITH PROPOFOL N/A 02/22/2019   Procedure: ESOPHAGOGASTRODUODENOSCOPY  (EGD) WITH PROPOFOL;  Surgeon: Toledo, Benay Pike, MD;  Location: [facility] ENDOSCOPY;  Service: Gastroenterology;  Laterality: N/A;   ESOPHAGOGASTRODUODENOSCOPY (EGD) WITH PROPOFOL N/A 01/15/2020   Procedure: ESOPHAGOGASTRODUODENOSCOPY (EGD) WITH PROPOFOL;  Surgeon: Lesly Rubenstein, MD;  Location: [facility] ENDOSCOPY;  Service: Endoscopy;  Laterality: N/A;   ESOPHAGOGASTRODUODENOSCOPY (EGD) WITH PROPOFOL N/A 12/18/2020   Procedure: ESOPHAGOGASTRODUODENOSCOPY (EGD) WITH PROPOFOL;  Surgeon: Annamaria Helling, DO;  [facility] ENDOSCOPY;  Service: Gastroenterology;  Laterality: N/A;   [place] IMPEDANCE STUDY N/A 02/04/2021   Procedure: [place] IMPEDANCE STUDY;  Surgeon: Mauri Pole, MD;  [facility];  Service: Endoscopy;  Laterality: N/A;   ULNAR NERVE TRANSPOSITION Bilateral 2019   cubital tunnel release   Social History   Socioeconomic History   Marital status: Married    Spouse name: Eduard Roux   Number of children: 0   Years of education: Not on file   Highest education level: Not on file  Occupational History   Not on file  Tobacco Use   Smoking status: Former    Types: Cigars   Smokeless tobacco: Former  Scientific laboratory technician Use: Never used  Substance and Sexual Activity   Alcohol use: Yes    Alcohol/week: 5.0 - 7.0 standard drinks of alcohol    Types: 5 - 7 Cans of beer per week   Drug use: No   Sexual activity: Yes    Partners: Female  Other Topics Concern   Not on file  Social History Narrative   Not on file   Social Determinants of Health   Financial Resource Strain: Not on file  Food Insecurity: Not on file  Transportation Needs: Not on file  Physical Activity: Not on file  Stress: Not on file  Social Connections: Not on file   Allergies  Allergen Reactions   Gadolinium Derivatives Hives   Ivp Dye [Iodinated Contrast Media]    Family History  Problem Relation Age of Onset   Diabetes Mother    Crohn's disease Father 48   Colon polyps Father     Cancer Maternal Grandmother    Colon cancer Maternal Grandmother          Current Outpatient Medications (Other):    omeprazole (PRILOSEC) 40 MG capsule, Take 1 capsule (40 mg total) by mouth 2 (two) times daily before meals   Prucalopride Succinate (MOTEGRITY) 2 MG TABS, Take 1 tablet (2 mg total) by mouth daily.   Vitamin D, Ergocalciferol, (DRISDOL) 1.25 MG (50000 UNIT) CAPS capsule, Take 1 capsule (50,000 Units total) by mouth every 7 (seven) days.   Reviewed prior external information including notes and imaging from  primary care provider As well as notes that were available from care everywhere and other healthcare systems.  Past medical history, social, surgical and family history all reviewed in electronic medical record.  No pertanent information unless stated regarding to the chief complaint.   Review of Systems:  No headache, visual changes, nausea, vomiting, diarrhea, constipation, dizziness, abdominal pain, skin rash, fevers, chills, night sweats, weight loss, swollen lymph nodes, body aches, joint swelling, chest pain, shortness of breath, mood changes. POSITIVE muscle aches  Objective  Blood pressure 128/78, pulse 68, height 5\' 10"  (1.778 m), weight 169 lb (76.7 kg), SpO2 98 %.   General: No apparent distress alert and oriented x3 mood and affect normal, dressed appropriately.  HEENT: Pupils equal, extraocular movements intact  Respiratory: Patient's speak in full sentences and does not appear short of breath  Cardiovascular: No lower extremity edema, non tender, no erythema  Right knee severely tender to palpation just proximal to the medial joint line.  Patient does have some very mild crepitus noted.  Does even ambulate with a slight limp.  Left knee and still does not have any swelling at the moment.  Very minorly tender over the quadricep tendon.  Limited muscular skeletal ultrasound was performed and interpreted by , M  Patient is nontender to  palpation over the medial aspect where the probe is on the right leg.  Cortical irregularity noted.  Does this appear to be more of a stress reaction with increasing Doppler flow as well. Impression: Medial tibial stress syndrome versus true stress fracture    Impression and Recommendations:     The above documentation has been reviewed and is accurate and complete Antoine Primas, DO

## 2021-12-17 ENCOUNTER — Ambulatory Visit (INDEPENDENT_AMBULATORY_CARE_PROVIDER_SITE_OTHER): Payer: 59 | Admitting: Family Medicine

## 2021-12-17 ENCOUNTER — Ambulatory Visit: Payer: Self-pay

## 2021-12-17 VITALS — BP 128/78 | HR 68 | Ht 70.0 in | Wt 169.0 lb

## 2021-12-17 DIAGNOSIS — M84351A Stress fracture, right femur, initial encounter for fracture: Secondary | ICD-10-CM | POA: Diagnosis not present

## 2021-12-17 DIAGNOSIS — M65262 Calcific tendinitis, left lower leg: Secondary | ICD-10-CM

## 2021-12-17 DIAGNOSIS — M25562 Pain in left knee: Secondary | ICD-10-CM

## 2021-12-17 HISTORY — DX: Stress fracture, right femur, initial encounter for fracture: M84.351A

## 2021-12-17 NOTE — Assessment & Plan Note (Signed)
Distal femur.  Discussed which activities to do and which ones to avoid.  Discussed inject insulin which ones to avoid.  Discussed vitamin D supplementation with a once weekly prescription dose of 50,000 that we have prescribed.  We will do this through the winter..  Increase activity slowly.  Follow-up again in 6 weeks to make sure he is improving and then start more increasing in the running again.  At this moment we will do just walking and, biking and swimming.

## 2021-12-17 NOTE — Assessment & Plan Note (Signed)
Significant improvement noted at this time.  With patient having less stress I am expecting that that this could be better as well.  Increase activity slowly over the course neck several weeks.  Follow-up again in 6 to 8 weeks.

## 2021-12-17 NOTE — Patient Instructions (Signed)
Just a little fall out from race K2 236mcg daily for 1 month Continue Vit D No running or jumping for 5-6 weeks

## 2021-12-18 ENCOUNTER — Encounter: Payer: Self-pay | Admitting: Family Medicine

## 2022-01-26 NOTE — Progress Notes (Signed)
Charles Simmons [address] [place] [address] Phone: [phone] Subjective:    I'm seeing this patient by the request  of:  Reubin Milan, MD  CC: Left knee pain  RTM:[id number]  12/17/2021 Significant improvement noted at this time.  With patient having less stress I am expecting that that this could be better as well.  Increase activity slowly over the course neck several weeks.  Follow-up again in 6 to 8 weeks.     Distal femur.  Discussed which activities to do and which ones to avoid.  Discussed inject insulin which ones to avoid.  Discussed vitamin D supplementation with a once weekly prescription dose of 50,000 that we have prescribed.  We will do this through the winter..  Increase activity slowly.  Follow-up again in 6 weeks to make sure he is improving and then start more increasing in the running again.  At this moment we will do just walking and, biking and swimming.     Update 112/08/2021 Charles Simmons is a 41 y.o. male coming in with complaint of L lower leg and R femur pain. Patient states legs are doing a lot better. Left knee is still a little weak, but the pain is not there. Patient has not been running which has helped and has went to low impact workouts.  Patient has noticed some increase in instability.  Just does not feel like the knee is right.     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   Dx'd 2012 - had some headaches but improved MRI 2019 - Normal, ethmoid sinusitis   Cholecystitis    Eosinophilic esophagitis    Eosinophilic esophagitis 02/25/2017   GERD (gastroesophageal reflux disease)    Renal infection    Past Surgical History:  Procedure Laterality Date   CHOLECYSTECTOMY  2006   ESOPHAGEAL MANOMETRY N/A 02/04/2021   Procedure: ESOPHAGEAL MANOMETRY (EM);  Surgeon: Napoleon Form, MD;  Location: [facility];  Service:  Endoscopy;  Laterality: N/A;   ESOPHAGOGASTRODUODENOSCOPY     ESOPHAGOGASTRODUODENOSCOPY (EGD) WITH PROPOFOL N/A 02/22/2019   Procedure: ESOPHAGOGASTRODUODENOSCOPY (EGD) WITH PROPOFOL;  Surgeon: Toledo, Boykin Nearing, MD;  Location: [facility];  Service: Gastroenterology;  Laterality: N/A;   ESOPHAGOGASTRODUODENOSCOPY (EGD) WITH PROPOFOL N/A 01/15/2020   Procedure: ESOPHAGOGASTRODUODENOSCOPY (EGD) WITH PROPOFOL;  Surgeon: Regis Bill, MD;  Location: [facility];  Service: Endoscopy;  Laterality: N/A;   ESOPHAGOGASTRODUODENOSCOPY (EGD) WITH PROPOFOL N/A 12/18/2020   Procedure: ESOPHAGOGASTRODUODENOSCOPY (EGD) WITH PROPOFOL;  Surgeon: Jaynie Collins, DO;  Location: [facility] END[facility]COPY;  Service: Gastroenterology;  Laterality: N/A;   PH IMPEDANCE STUDY N/A 02/04/2021   Procedure: PH IMPEDANCE STUDY;  Surgeon: Napoleon Form, MD;  Location: [facility];  Service: Endoscopy;  Laterality: N/A;   ULNAR NERVE TRANSPOSITION Bilateral 2019   cubital tunnel release   Social History   Socioeconomic History   Marital status: Married    Spouse name: Charles Simmons   Number of children: 0   Years of education: Not on file   Highest education level: Not on file  Occupational History   Not on file  Tobacco Use   Smoking status: Former    Types: Cigars   Smokeless tobacco: Former  Building services engineer Use: Never used  Substance and Sexual Activity   Alcohol use: Yes    Alcohol/week: 5.0 - 7.0 standard drinks of alcohol  Types: 5 - 7 Cans of beer per week   Drug use: No   Sexual activity: Yes    Partners: Female  Other Topics Concern   Not on file  Social History Narrative   Not on file   Social Determinants of Health   Financial Resource Strain: Not on file  Food Insecurity: Not on file  Transportation Needs: Not on file  Physical Activity: Not on file  Stress: Not on file  Social Connections: Not on file   Allergies  Allergen Reactions   Gadolinium Derivatives Hives    Ivp Dye [Iodinated Contrast Media]    Family History  Problem Relation Age of Onset   Diabetes Mother    Crohn's disease Father 82   Colon polyps Father    Cancer Maternal Grandmother    Colon cancer Maternal Grandmother          Current Outpatient Medications (Other):    omeprazole (PRILOSEC) 40 MG capsule, Take 1 capsule (40 mg total) by mouth 2 (two) times daily before meals   Vitamin D, Ergocalciferol, (DRISDOL) 1.25 MG (50000 UNIT) CAPS capsule, Take 1 capsule (50,000 Units total) by mouth every 7 (seven) days.   Prucalopride Succinate (MOTEGRITY) 2 MG TABS, Take 1 tablet (2 mg total) by mouth daily. (Patient not taking: Reported on 01/28/2022)   Reviewed prior external information including notes and imaging from  primary care provider As well as notes that were available from care everywhere and other healthcare systems.  Past medical history, social, surgical and family history all reviewed in electronic medical record.  No pertanent information unless stated regarding to the chief complaint.   Review of Systems:  No headache, visual changes, nausea, vomiting, diarrhea, constipation, dizziness, abdominal pain, skin rash, fevers, chills, night sweats, weight loss, swollen lymph nodes, body aches, joint swelling, chest pain, shortness of breath, mood changes. POSITIVE muscle aches  Objective  Blood pressure 124/72, pulse 77, height 5\' 10"  (1.778 m), weight 167 lb (75.8 kg), SpO2 99 %.   General: No apparent distress alert and oriented x3 mood and affect normal, dressed appropriately.  HEENT: Pupils equal, extraocular movements intact  Respiratory: Patient's speak in full sentences and does not appear short of breath  Cardiovascular: No lower extremity edema, non tender, no erythema  On exam patient is no longer having any significant tenderness over the medial tibial area proximal.  Patient now is more tender over the left knee on the lateral aspect.  No severe pain with  straining of the knee LCL.  No laxity though noted.  Patient has mild crepitus of the patella but negative grind test.  Negative McMurray's.  Limited muscular skeletal ultrasound was performed and interpreted by , M  Limited ultrasound does not show any significant cortical defect now noted of the medial tibial area.  Patient has no significant hypoechoic changes within the patellofemoral joint.  Patient though does have a an atypical appearance of the proximal aspect of the LCL.  No gapping on dynamic testing noted.  Contralateral LCL does appear to have less hypoechoic changes than this 1. Impression: Abnormality of the LCL    Impression and Recommendations:

## 2022-01-28 ENCOUNTER — Ambulatory Visit (INDEPENDENT_AMBULATORY_CARE_PROVIDER_SITE_OTHER): Payer: 59 | Admitting: Family Medicine

## 2022-01-28 ENCOUNTER — Encounter: Payer: Self-pay | Admitting: Family Medicine

## 2022-01-28 ENCOUNTER — Ambulatory Visit: Payer: Self-pay

## 2022-01-28 VITALS — BP 124/72 | HR 77 | Ht 70.0 in | Wt 167.0 lb

## 2022-01-28 DIAGNOSIS — M25562 Pain in left knee: Secondary | ICD-10-CM

## 2022-01-28 DIAGNOSIS — M2352 Chronic instability of knee, left knee: Secondary | ICD-10-CM

## 2022-01-28 NOTE — Patient Instructions (Signed)
Good to see you  Try to wear hinge brace more  Use machine weights more Ice 20 minutes 2 times daily. Usually after activity and before bed. See me again in 4-5 weeks

## 2022-01-29 ENCOUNTER — Encounter: Payer: Self-pay | Admitting: Family Medicine

## 2022-01-29 DIAGNOSIS — M2352 Chronic instability of knee, left knee: Secondary | ICD-10-CM | POA: Insufficient documentation

## 2022-01-29 NOTE — Assessment & Plan Note (Signed)
Patient's knees continue to have difficulty on this left side.  Patient has had difficulty with bursitis previously, medial tibial stress reactions, and now having more instability on the lateral aspect of the knee.  Questionable concern for potential loose body that is being missed with ultrasound and x-rays.  Also now patient does have an abnormality noted of the LCL that is concerning for a partial tear noted.  We discussed that this could be a new injury as well but with all the difficulties patient is having and failing all conservative therapy including formal physical therapy, personal trainer, home exercises, I would like to consider advanced imaging which I do believe is warranted with him having intermittent pain for over a year.  Does seem to be worsening.  Follow-up with me again after imaging to discuss further

## 2022-02-01 ENCOUNTER — Other Ambulatory Visit: Payer: Self-pay

## 2022-02-02 ENCOUNTER — Other Ambulatory Visit: Payer: Self-pay

## 2022-02-08 ENCOUNTER — Encounter: Payer: Self-pay | Admitting: Internal Medicine

## 2022-02-08 ENCOUNTER — Ambulatory Visit: Payer: 59 | Admitting: Internal Medicine

## 2022-02-08 ENCOUNTER — Other Ambulatory Visit: Payer: Self-pay

## 2022-02-08 VITALS — BP 122/64 | HR 87 | Temp 98.1°F | Ht 70.0 in | Wt 172.0 lb

## 2022-02-08 DIAGNOSIS — R6889 Other general symptoms and signs: Secondary | ICD-10-CM | POA: Diagnosis not present

## 2022-02-08 DIAGNOSIS — R509 Fever, unspecified: Secondary | ICD-10-CM | POA: Diagnosis not present

## 2022-02-08 LAB — POCT INFLUENZA A/B
Influenza A, POC: NEGATIVE
Influenza B, POC: NEGATIVE

## 2022-02-08 LAB — POC COVID19 BINAXNOW: SARS Coronavirus 2 Ag: NEGATIVE

## 2022-02-08 MED ORDER — PROMETHAZINE-DM 6.25-15 MG/5ML PO SYRP
5.0000 mL | ORAL_SOLUTION | Freq: Four times a day (QID) | ORAL | 0 refills | Status: AC | PRN
Start: 2022-02-08 — End: 2022-02-17
  Filled 2022-02-08: qty 118, 6d supply, fill #0

## 2022-02-08 NOTE — Progress Notes (Signed)
  Date:  02/08/2022   Name:  Mehul Clay Polivka   DOB:  05/03/1980   MRN:  [id number]   Chief Complaint: Fever  Fever  This is a new problem. The current episode started yesterday. The problem occurs constantly. The problem has been gradually worsening. The maximum temperature noted was 100 to 100.9 F. The temperature was taken using an oral thermometer. Associated symptoms include congestion, coughing, diarrhea, headaches, muscle aches and a sore throat. Pertinent negatives include no vomiting. Associated symptoms comments: Back pain . He has tried NSAIDs for the symptoms. The treatment provided mild relief.  Onset of symptoms mid day yesterday.  Covid negative at home this AM. Mostly head congestion, headache, body aches.  Minimal cough that is worsening.   Lab Results  Component Value Date   NA 139 07/08/2021   K 4.3 07/08/2021   CO2 24 07/08/2021   GLUCOSE 84 07/08/2021   BUN 11 07/08/2021   CREATININE 1.05 07/08/2021   CALCIUM 9.4 07/08/2021   EGFR 93 07/08/2021   GFRNONAA 95 08/31/2018   Lab Results  Component Value Date   CHOL 156 07/08/2021   HDL 48 07/08/2021   LDLCALC 91 07/08/2021   TRIG 88 07/08/2021   CHOLHDL 3.3 07/08/2021   Lab Results  Component Value Date   TSH 1.480 07/08/2021   No results found for: "HGBA1C" Lab Results  Component Value Date   WBC 4.7 07/08/2021   HGB 15.0 07/08/2021   HCT 44.1 07/08/2021   MCV 98 (H) 07/08/2021   PLT 265 07/08/2021   Lab Results  Component Value Date   ALT 19 07/08/2021   AST 19 07/08/2021   ALKPHOS 89 07/08/2021   BILITOT 1.6 (H) 07/08/2021   No results found for: "25OHVITD2", "25OHVITD3", "VD25OH"   Review of Systems  Constitutional:  Positive for chills, fatigue and fever. Negative for diaphoresis.  HENT:  Positive for congestion and sore throat.   Respiratory:  Positive for cough.   Gastrointestinal:  Positive for diarrhea. Negative for vomiting.  Neurological:  Positive for headaches. Negative  for dizziness and light-headedness.  Psychiatric/Behavioral:  Negative for dysphoric mood and sleep disturbance. The patient is not nervous/anxious.     Patient Active Problem List   Diagnosis Date Noted  . Recurrent left knee instability 01/29/2022  . Stress reaction of shaft of femur, right, initial encounter 12/17/2021  . Chronic idiopathic constipation 07/08/2021  . Calcific tendonitis of left lower leg 07/01/2021  . Heartburn   . Gastroesophageal reflux disease 09/12/2020  . Hamstring tightness of left lower extremity 06/04/2020  . Pes anserinus bursitis of right knee 05/09/2020  . Left medial tibial stress syndrome 04/01/2020  . Chronic sinusitis, unspecified 08/31/2018    Allergies  Allergen Reactions  . Gadolinium Derivatives Hives  . Iodinated Contrast Media Hives    Past Surgical History:  Procedure Laterality Date  . CHOLECYSTECTOMY  2006  . ESOPHAGEAL MANOMETRY N/A 02/04/2021   Procedure: ESOPHAGEAL MANOMETRY (EM);  Surgeon: Nandigam, Kavitha V, MD;  Location: [facility];  Service: Endoscopy;  Laterality: N/A;  . ESOPHAGOGASTRODUODENOSCOPY    . ESOPHAGOGASTRODUODENOSCOPY (EGD) WITH PROPOFOL N/A 02/22/2019   Procedure: ESOPHAGOGASTRODUODENOSCOPY (EGD) WITH PROPOFOL;  Surgeon: Toledo, Teodoro K, MD;  Location: [facility] ENDOSCOPY;  Service: Gastroenterology;  Laterality: N/A;  . ESOPHAGOGASTRODUODENOSCOPY (EGD) WITH PROPOFOL N/A 01/15/2020   Procedure: ESOPHAGOGASTRODUODENOSCOPY (EGD) WITH PROPOFOL;  Surgeon: Locklear, Cameron T, MD;  Location: [facility];  Service: Endoscopy;  Laterality: N/A;  . ESOPHAGOGASTRODUODENOSCOPY (EGD) WITH PROPOFOL N/A 12/18/2020     Procedure: ESOPHAGOGASTRODUODENOSCOPY (EGD) WITH PROPOFOL;  Surgeon: Russo, Steven Michael, DO;  Location: [facility] END[facility];  Service: Gastroenterology;  Laterality: N/A;  . PH IMPEDANCE STUDY N/A 02/04/2021   Procedure: PH IMPEDANCE STUDY;  Surgeon: Nandigam, Kavitha V, MD;  Location: [facility];  Service:  Endoscopy;  Laterality: N/A;  . ULNAR NERVE TRANSPOSITION Bilateral 2019   cubital tunnel release    Social History   Tobacco Use  . Smoking status: Former    Types: Cigars  . Smokeless tobacco: Former  Vaping Use  . Vaping Use: Never used  Substance Use Topics  . Alcohol use: Yes    Alcohol/week: 5.0 - 7.0 standard drinks of alcohol    Types: 5 - 7 Cans of beer per week  . Drug use: No     Medication list has been reviewed and updated.  Current Meds  Medication Sig  . omeprazole (PRILOSEC) 40 MG capsule Take 1 capsule (40 mg total) by mouth 2 (two) times daily before meals  . Prucalopride Succinate (MOTEGRITY) 2 MG TABS Take 1 tablet (2 mg total) by mouth daily.  . Vitamin D, Ergocalciferol, (DRISDOL) 1.25 MG (50000 UNIT) CAPS capsule Take 1 capsule (50,000 Units total) by mouth every 7 (seven) days.       07/08/2021    8:03 AM 06/30/2020    8:50 AM  GAD 7 : Generalized Anxiety Score  Nervous, Anxious, on Edge 0 0  Control/stop worrying 0 0  Worry too much - different things 0 0  Trouble relaxing 0 0  Restless 0 0  Easily annoyed or irritable 0 0  Afraid - awful might happen 0 0  Total GAD 7 Score 0 0  Anxiety Difficulty Not difficult at all Not difficult at all       07/08/2021    8:03 AM 06/30/2020    8:50 AM 08/31/2018    9:03 AM  Depression screen PHQ 2/9  Decreased Interest 0 0 0  Down, Depressed, Hopeless 0 0 0  PHQ - 2 Score 0 0 0  Altered sleeping 0 0 0  Tired, decreased energy 0 0 0  Change in appetite 0 0 0  Feeling bad or failure about yourself  0 0 0  Trouble concentrating 0 0 0  Moving slowly or fidgety/restless 0 0 0  Suicidal thoughts 0 0 0  PHQ-9 Score 0 0 0  Difficult doing work/chores Not difficult at all Not difficult at all Not difficult at all    BP Readings from Last 3 Encounters:  02/08/22 122/64  01/28/22 124/72  12/17/21 128/78    Physical Exam Vitals and nursing note reviewed.  Constitutional:      General: He is not in  acute distress.    Appearance: He is well-developed. He is ill-appearing.  HENT:     Head: Normocephalic and atraumatic.     Right Ear: Tympanic membrane normal.     Left Ear: Tympanic membrane normal.     Nose:     Right Sinus: No maxillary sinus tenderness.     Left Sinus: No maxillary sinus tenderness.  Cardiovascular:     Rate and Rhythm: Normal rate and regular rhythm.     Pulses: Normal pulses.     Heart sounds: No murmur heard. Pulmonary:     Effort: Pulmonary effort is normal. No respiratory distress.     Breath sounds: No wheezing or rhonchi.  Musculoskeletal:     Cervical back: Normal range of motion.  Lymphadenopathy:       Cervical: No cervical adenopathy.  Skin:    General: Skin is warm and dry.     Findings: No rash.  Neurological:     Mental Status: He is alert and oriented to person, place, and time.  Psychiatric:        Mood and Affect: Mood normal.        Behavior: Behavior normal.    Wt Readings from Last 3 Encounters:  02/08/22 172 lb (78 kg)  01/28/22 167 lb (75.8 kg)  12/17/21 169 lb (76.7 kg)    BP 122/64   Pulse 87   Temp 98.1 F (36.7 C) (Oral)   Ht 5' 10" (1.778 m)   Wt 172 lb (78 kg)   SpO2 97%   BMI 24.68 kg/m   Assessment and Plan: 1. Flu-like symptoms Negative covid and influenza.  May still be covid with testing too early. Supportive care, fluids, Advil or tylenol every 6 hours, rest Test again for Covid in AM and call if positive. - POCT Influenza A/B - POC COVID-19 - promethazine-dextromethorphan (PROMETHAZINE-DM) 6.25-15 MG/5ML syrup; Take 5 mLs by mouth 4 (four) times daily as needed for up to 9 days for cough.  Dispense: 118 mL; Refill: 0  2. Fever, unspecified fever cause - POC COVID-19   Partially dictated using Dragon software. Any errors are unintentional.  Laura Berglund, MD [facility]  02/08/2022     

## 2022-02-09 ENCOUNTER — Other Ambulatory Visit: Payer: 59

## 2022-02-17 ENCOUNTER — Ambulatory Visit
Admission: RE | Admit: 2022-02-17 | Discharge: 2022-02-17 | Disposition: A | Discharge status: Home / Self Care | Payer: 59 | Source: Ambulatory Visit | Attending: Family Medicine | Admitting: Family Medicine

## 2022-02-17 DIAGNOSIS — M25562 Pain in left knee: Secondary | ICD-10-CM

## 2022-02-17 DIAGNOSIS — S8992XA Unspecified injury of left lower leg, initial encounter: Secondary | ICD-10-CM | POA: Diagnosis not present

## 2022-02-23 NOTE — Progress Notes (Unsigned)
[facility] [place] Phone: [phone] Subjective:   Charles Simmons, am serving as a scribe for Dr. Hulan Saas.  I'm seeing this patient by the request  of:  Glean Hess, MD  CC: Left knee pain follow-up  [id number]  01/28/2022 Patient's knees continue to have difficulty on this left side.  Patient has had difficulty with bursitis previously, medial tibial stress reactions, and now having more instability on the lateral aspect of the knee.  Questionable concern for potential loose body that is being missed with ultrasound and x-rays.  Also now patient does have an abnormality noted of the LCL that is concerning for a partial tear noted.  We discussed that this could be a new injury as well but with all the difficulties patient is having and failing all conservative therapy including formal physical therapy, personal trainer, home exercises, I would like to consider advanced imaging which I do believe is warranted with him having intermittent pain for over a year.  Does seem to be worsening.  Follow-up with me again after imaging to discuss further     Update 02/25/2022 Charles Simmons is a 42 y.o. male coming in with complaint of L knee pain. Patient states knee is still the same. Patient went to the gym and was doing some squats and it was not unbareable but it wasn't the best. Had an MRI       Past Medical History:  Diagnosis Date   Allergic genetic state    Arnold-Chiari malformation, type I ([place]) 06/16/2011   Dx'd 2012 - had some headaches but improved MRI 2019 - Normal, ethmoid sinusitis   Cholecystitis    Eosinophilic esophagitis    Eosinophilic esophagitis 29/52/8413   GERD (gastroesophageal reflux disease)    Renal infection    Past Surgical History:  Procedure Laterality Date   CHOLECYSTECTOMY  2006   ESOPHAGEAL MANOMETRY N/A 02/04/2021   Procedure: ESOPHAGEAL MANOMETRY (EM);  Surgeon:  Mauri Pole, MD;  [facility]: [facility];  Service: Endoscopy;  Laterality: N/A;   ESOPHAGOGASTRODUODENOSCOPY     ESOPHAGOGASTRODUODENOSCOPY (EGD) WITH PROPOFOL N/A 02/22/2019   Procedure: ESOPHAGOGASTRODUODENOSCOPY (EGD) WITH PROPOFOL;  Surgeon: Toledo, Benay Pike, MD;  Location: [facility] END[facility];  Service: Gastroenterology;  Laterality: N/A;   ESOPHAGOGASTRODUODENOSCOPY (EGD) WITH PROPOFOL N/A 01/15/2020   Procedure: ESOPHAGOGASTRODUODENOSCOPY (EGD) WITH PROPOFOL;  Surgeon: Lesly Rubenstein, MD;  Location: [facility] ENDOSCOPY;  Service: Endoscopy;  Laterality: N/A;   ESOPHAGOGASTRODUODENOSCOPY (EGD) WITH PROPOFOL N/A 12/18/2020   Procedure: ESOPHAGOGASTRODUODENOSCOPY (EGD) WITH PROPOFOL;  Surgeon: Annamaria Helling, DO;  Location[facility] ENDOSCOPY;  Service: Gastroenterology;  Laterality: N/A;   [place] IMPEDANCE STUDY N/A 02/04/2021   Procedure: [place] IMPEDANCE STUDY;  Surgeon: Mauri Pole, MD;  [facility];  Service: Endoscopy;  Laterality: N/A;   ULNAR NERVE TRANSPOSITION Bilateral 2019   cubital tunnel release   Social History   Socioeconomic History   Marital status: Married    Spouse name: Eduard Roux   Number of children: 0   Years of education: Not on file   Highest education level: Not on file  Occupational History   Not on file  Tobacco Use   Smoking status: Former    Types: Cigars   Smokeless tobacco: Former  Scientific laboratory technician Use: Never used  Substance and Sexual Activity   Alcohol use: Yes    Alcohol/week: 5.0 - 7.0 standard drinks of alcohol    Types: 5 -  7 Cans of beer per week   Drug use: No   Sexual activity: Yes    Partners: Female  Other Topics Concern   Not on file  Social History Narrative   Not on file   Social Determinants of Health   Financial Resource Strain: Low Risk  (02/08/2022)   Overall Financial Resource Strain (CARDIA)    Difficulty of Paying Living Expenses: Not hard at all  Food Insecurity: No Food Insecurity  (02/08/2022)   Hunger Vital Sign    Worried About Running Out of Food in the Last Year: Never true    Ran Out of Food in the Last Year: Never true  Transportation Needs: No Transportation Needs (02/08/2022)   PRAPARE - Hydrologist (Medical): No    Lack of Transportation (Non-Medical): No  Physical Activity: Not on file  Stress: Not on file  Social Connections: Not on file   Allergies  Allergen Reactions   Gadolinium Derivatives Hives   Iodinated Contrast Media Hives   Family History  Problem Relation Age of Onset   Diabetes Mother    Crohn's disease Father 110   Colon polyps Father    Cancer Maternal Grandmother    Colon cancer Maternal Grandmother          Current Outpatient Medications (Other):    omeprazole (PRILOSEC) 40 MG capsule, Take 1 capsule (40 mg total) by mouth 2 (two) times daily before meals   Prucalopride Succinate (MOTEGRITY) 2 MG TABS, Take 1 tablet (2 mg total) by mouth daily.   Vitamin D, Ergocalciferol, (DRISDOL) 1.25 MG (50000 UNIT) CAPS capsule, Take 1 capsule (50,000 Units total) by mouth every 7 (seven) days.   Reviewed prior external information including notes and imaging from  primary care provider As well as notes that were available from care everywhere and other healthcare systems.  Past medical history, social, surgical and family history all reviewed in electronic medical record.  No pertanent information unless stated regarding to the chief complaint.   Review of Systems:  No headache, visual changes, nausea, vomiting, diarrhea, constipation, dizziness, abdominal pain, skin rash, fevers, chills, night sweats, weight loss, swollen lymph nodes, body aches, joint swelling, chest pain, shortness of breath, mood changes. POSITIVE muscle aches  Objective  Blood pressure 122/84, pulse 83, height 5\' 10"  (1.778 m), weight 173 lb (78.5 kg), SpO2 98 %.   General: No apparent distress alert and oriented x3 mood and  affect normal, dressed appropriately.  HEENT: Pupils equal, extraocular movements intact  Respiratory: Patient's speak in full sentences and does not appear short of breath  Cardiovascular: No lower extremity edema, non tender, no erythema      Impression and Recommendations:

## 2022-02-25 ENCOUNTER — Ambulatory Visit (INDEPENDENT_AMBULATORY_CARE_PROVIDER_SITE_OTHER): Payer: 59 | Admitting: Family Medicine

## 2022-02-25 ENCOUNTER — Ambulatory Visit: Payer: Self-pay

## 2022-02-25 VITALS — BP 122/84 | HR 83 | Ht 70.0 in | Wt 173.0 lb

## 2022-02-25 DIAGNOSIS — M25562 Pain in left knee: Secondary | ICD-10-CM | POA: Diagnosis not present

## 2022-02-25 DIAGNOSIS — M94262 Chondromalacia, left knee: Secondary | ICD-10-CM

## 2022-02-25 NOTE — Assessment & Plan Note (Signed)
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  Patient does have a high riding patella that likely contributes to some of the anatomy.  Discussed with patient at great length about different treatment options.  We discussed with patient about the possibility of viscosupplementation, PRP or surgical intervention.  Patient would like to discuss this with his wife.  Will consider likely the viscosupplementation he feels.  Patient will come back and see me again time and we will do more treatment.  Total time discussing with patient as well as reviewing patient's imaging 32 minutes

## 2022-02-25 NOTE — Patient Instructions (Signed)
Good to see you  Will try and get approval for gel Set up in 4 weeks follow up

## 2022-03-10 ENCOUNTER — Encounter: Payer: Self-pay | Admitting: Family Medicine

## 2022-03-15 ENCOUNTER — Telehealth: Payer: Self-pay | Admitting: *Deleted

## 2022-03-15 NOTE — Telephone Encounter (Signed)
L knee visco initiated through portal.  

## 2022-03-16 NOTE — Telephone Encounter (Signed)
Prior auth form completed & faxed.  

## 2022-03-17 NOTE — Telephone Encounter (Signed)
L knee Monovisc approved.

## 2022-03-25 ENCOUNTER — Other Ambulatory Visit: Payer: Self-pay

## 2022-03-31 NOTE — Progress Notes (Signed)
Charles Simmons [place] [address] [place] Phone: [phone] Subjective:   Charles Simmons, am serving as a scribe for Dr. Hulan Saas.  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  Patient does have a high riding patella that likely contributes to some of the anatomy.  Discussed with patient at great length about different treatment options.  We discussed with patient about the possibility of viscosupplementation, PRP or surgical intervention.  Patient would like to discuss this with his wife.  Will consider likely the viscosupplementation he feels.  Patient will come back and see me again time and we will do more treatment.  Total time discussing with patient as well as reviewing patient's imaging 32 minutes     Updated 04/02/2022 Charles Simmons is a 42 y.o. male coming in with complaint of L. knee pain. 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   Dx'd 2012 - had some headaches but improved MRI 2019 - Normal, ethmoid sinusitis   Cholecystitis    Eosinophilic esophagitis    Eosinophilic esophagitis [id number]   GERD (gastroesophageal reflux disease)    Renal infection    Past Surgical History:  Procedure Laterality Date   CHOLECYSTECTOMY  2006   ESOPHAGEAL MANOMETRY N/A 02/04/2021   Procedure: ESOPHAGEAL MANOMETRY (EM);  Surgeon: Mauri Pole, MD;  Location: [facility];  Service: Endoscopy;  Laterality: N/A;   ESOPHAGOGASTRODUODENOSCOPY     ESOPHAGOGASTRODUODENOSCOPY (EGD) WITH PROPOFOL N/A 02/22/2019   Procedure: ESOPHAGOGASTRODUODENOSCOPY (EGD) WITH PROPOFOL;  Surgeon: Toledo, Benay Pike, MD;  Location: [facility] ENDOSCOPY;  Service: Gastroenterology;  Laterality: N/A;    ESOPHAGOGASTRODUODENOSCOPY (EGD) WITH PROPOFOL N/A 01/15/2020   Procedure: ESOPHAGOGASTRODUODENOSCOPY (EGD) WITH PROPOFOL;  Surgeon: Lesly Rubenstein, MD;  Location: [facility] ENDOSCOPY;  Service: Endoscopy;  Laterality: N/A;   ESOPHAGOGASTRODUODENOSCOPY (EGD) WITH PROPOFOL N/A 12/18/2020   Procedure: ESOPHAGOGASTRODUODENOSCOPY (EGD) WITH PROPOFOL;  Surgeon: Annamaria Helling, DO;  Location: [facility] ENDOSCOPY;  Service: Gastroenterology;  Laterality: N/A;   [place] IMPEDANCE STUDY N/A 02/04/2021   Procedure: [place] IMPEDANCE STUDY;  Surgeon: Mauri Pole, MD;  Location: [facility]OS[facility];  Service: Endoscopy;  Laterality: N/A;   ULNAR NERVE TRANSPOSITION Bilateral 2019   cubital tunnel release   Social History   Socioeconomic History   Marital status: Married    Spouse name: Eduard Roux   Number of children: 0   Years of education: Not on file   Highest education level: Not on file  Occupational History   Not on file  Tobacco Use   Smoking status: Former    Types: Cigars   Smokeless tobacco: Former  Scientific laboratory technician Use: Never used  Substance and Sexual Activity   Alcohol use: Yes    Alcohol/week: 5.0 - 7.0 standard drinks of alcohol    Types: 5 - 7 Cans of beer per week   Drug use: No   Sexual activity: Yes    Partners: Female  Other Topics Concern   Not on file  Social History Narrative   Not on file   Social Determinants of Health   Financial Resource Strain: Low Risk  (02/08/2022)   Overall Financial Resource Strain (CARDIA)  Difficulty of Paying Living Expenses: Not hard at all  Food Insecurity: No Food Insecurity (02/08/2022)   Hunger Vital Sign    Worried About Running Out of Food in the Last Year: Never true    Ran Out of Food in the Last Year: Never true  Transportation Needs: No Transportation Needs (02/08/2022)   PRAPARE - Hydrologist (Medical): No    Lack of Transportation (Non-Medical): No  Physical Activity: Not on file   Stress: Not on file  Social Connections: Not on file   Allergies  Allergen Reactions   Gadolinium Derivatives Hives   Iodinated Contrast Media Hives   Family History  Problem Relation Age of Onset   Diabetes Mother    Crohn's disease Father 63   Colon polyps Father    Cancer Maternal Grandmother    Colon cancer Maternal Grandmother          Current Outpatient Medications (Other):    omeprazole (PRILOSEC) 40 MG capsule, Take 1 capsule (40 mg total) by mouth 2 (two) times daily before meals   Prucalopride Succinate (MOTEGRITY) 2 MG TABS, Take 1 tablet (2 mg total) by mouth daily.   Vitamin D, Ergocalciferol, (DRISDOL) 1.25 MG (50000 UNIT) CAPS capsule, Take 1 capsule (50,000 Units total) by mouth every 7 (seven) days.   Reviewed prior external information including notes and imaging from  primary care provider As well as notes that were available from care everywhere and other healthcare systems.  Past medical history, social, surgical and family history all reviewed in electronic medical record.  No pertanent information unless stated regarding to the chief complaint.   Review of Systems:  No headache, visual changes, nausea, vomiting, diarrhea, constipation, dizziness, abdominal pain, skin rash, fevers, chills, night sweats, weight loss, swollen lymph nodes, body aches, joint swelling, chest pain, shortness of breath, mood changes. POSITIVE muscle aches  Objective  Blood pressure 116/84, pulse 65, height 5' 10"$  (1.778 m), weight 169 lb (76.7 kg), SpO2 98 %.   General: No apparent distress alert and oriented x3 mood and affect normal, dressed appropriately.  HEENT: Pupils equal, extraocular movements intact  Respiratory: Patient's speak in full sentences and does not appear short of breath  Cardiovascular: No lower extremity edema, non tender, no erythema  Left knee does have some tenderness noted on exam today.  Does still have trace effusion noted of the patellofemoral  joint.  After informed written and verbal consent, patient was seated on exam table. Left knee was prepped with alcohol swab and utilizing anterolateral approach, patient's left knee space was injected with 48 mg per 3 mL of Monovisc (sodium hyaluronate) in a prefilled syringe was injected easily into the knee through a 22-gauge needle..Patient tolerated the procedure well without immediate complications.    Impression and Recommendations:     The above documentation has been reviewed and is accurate and complete Lyndal Pulley, DO

## 2022-04-02 ENCOUNTER — Ambulatory Visit: Payer: Self-pay

## 2022-04-02 ENCOUNTER — Ambulatory Visit (INDEPENDENT_AMBULATORY_CARE_PROVIDER_SITE_OTHER): Payer: 59 | Admitting: Family Medicine

## 2022-04-02 ENCOUNTER — Encounter: Payer: Self-pay | Admitting: Family Medicine

## 2022-04-02 VITALS — BP 116/84 | HR 65 | Ht 70.0 in | Wt 169.0 lb

## 2022-04-02 DIAGNOSIS — M94262 Chondromalacia, left knee: Secondary | ICD-10-CM | POA: Diagnosis not present

## 2022-04-02 DIAGNOSIS — M1712 Unilateral primary osteoarthritis, left knee: Secondary | ICD-10-CM

## 2022-04-02 MED ORDER — HYALURONAN 88 MG/4ML IX SOSY
88.0000 mg | PREFILLED_SYRINGE | Freq: Once | INTRA_ARTICULAR | Status: AC
  Administered 2022-04-02: 88 mg via INTRA_ARTICULAR

## 2022-04-02 NOTE — Patient Instructions (Addendum)
Gel injection in knee today Start working out in a week Ok to bike, swim, [employer] after a long day See me as needed

## 2022-04-02 NOTE — Assessment & Plan Note (Signed)
Viscosupplementation given today and tolerated the procedure well, discussed icing regimen and home exercise, which activities to do and which ones to avoid.  Increase activity slowly over the course of next several weeks discussed icing regimen.  Follow-up again in 6 to 8 weeks I believe the patient would do very well.

## 2022-04-21 ENCOUNTER — Other Ambulatory Visit: Payer: Self-pay

## 2022-04-22 ENCOUNTER — Other Ambulatory Visit: Payer: Self-pay

## 2022-05-09 ENCOUNTER — Other Ambulatory Visit: Payer: Self-pay

## 2022-05-10 ENCOUNTER — Other Ambulatory Visit: Payer: Self-pay

## 2022-05-10 MED ORDER — OMEPRAZOLE 40 MG PO CPDR
40.0000 mg | DELAYED_RELEASE_CAPSULE | Freq: Two times a day (BID) | ORAL | 3 refills | Status: DC
  Filled 2022-05-10: qty 180, 90d supply, fill #0
  Filled 2022-11-05: qty 180, 90d supply, fill #1
  Filled 2023-05-02: qty 180, 90d supply, fill #2

## 2022-05-11 ENCOUNTER — Other Ambulatory Visit: Payer: Self-pay

## 2022-05-13 ENCOUNTER — Other Ambulatory Visit: Payer: Self-pay

## 2022-05-14 ENCOUNTER — Other Ambulatory Visit: Payer: Self-pay

## 2022-05-17 ENCOUNTER — Other Ambulatory Visit: Payer: Self-pay

## 2022-05-31 ENCOUNTER — Other Ambulatory Visit: Payer: Self-pay

## 2022-07-13 ENCOUNTER — Encounter: Payer: Self-pay | Admitting: Internal Medicine

## 2022-07-13 ENCOUNTER — Ambulatory Visit (INDEPENDENT_AMBULATORY_CARE_PROVIDER_SITE_OTHER): Payer: 59 | Admitting: Internal Medicine

## 2022-07-13 VITALS — BP 122/74 | HR 84 | Ht 70.0 in | Wt 174.0 lb

## 2022-07-13 DIAGNOSIS — Z0000 Encounter for general adult medical examination without abnormal findings: Secondary | ICD-10-CM

## 2022-07-13 DIAGNOSIS — K2100 Gastro-esophageal reflux disease with esophagitis, without bleeding: Secondary | ICD-10-CM | POA: Diagnosis not present

## 2022-07-13 DIAGNOSIS — Z131 Encounter for screening for diabetes mellitus: Secondary | ICD-10-CM | POA: Diagnosis not present

## 2022-07-13 DIAGNOSIS — Z13220 Encounter for screening for lipoid disorders: Secondary | ICD-10-CM

## 2022-07-13 DIAGNOSIS — M94262 Chondromalacia, left knee: Secondary | ICD-10-CM | POA: Diagnosis not present

## 2022-07-13 DIAGNOSIS — K5904 Chronic idiopathic constipation: Secondary | ICD-10-CM | POA: Diagnosis not present

## 2022-07-13 NOTE — Assessment & Plan Note (Addendum)
Reflux symptoms are minimal on current therapy - omeprazole. He still has some swallowing/throat tightness but could not afford Motegrity No red flag signs such as weight loss, n/v, melena

## 2022-07-13 NOTE — Assessment & Plan Note (Signed)
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 Improving after injections; still able to exercise regularly

## 2022-07-13 NOTE — Assessment & Plan Note (Deleted)
Followed by GI; most recently on Motegrity No hx of colonoscopy

## 2022-07-13 NOTE — Progress Notes (Signed)
Date:  07/13/2022   Name:  Charles Simmons   DOB:  Oct 08, 1980   MRN:  [id number]   Chief Complaint: Annual Exam Charles Simmons is a 42 y.o. male who presents today for his Complete Annual Exam. He feels well. He reports exercising. He reports he is sleeping fairly well. He is a bit stressed by work - getting more responsibility and projects to complete but feels he is handling it well.  Colonoscopy: none  Immunization History  Administered Date(s) Administered   Influenza, High Dose Seasonal PF 12/03/2014   Influenza,inj,Quad PF,6+ Mos 02/25/2017, 01/27/2018, 12/18/2021   PFIZER Comirnaty(Gray Top)Covid-19 Tri-Sucrose Vaccine 02/19/2019, 03/09/2019, 12/07/2019   Pfizer Covid-19 Vaccine Bivalent Booster 48yrs & up 12/23/2020   Tdap 12/28/2014   Health Maintenance Due  Topic Date Due   Pneumococcal Vaccine 48-74 Years old (1 of 2 - PCV) Never done   COVID-19 Vaccine (5 - 2023-24 season) 10/23/2021    No results found for: "PSA1", "PSA"   Gastroesophageal Reflux He complains of heartburn. He reports no abdominal pain, no chest pain, no choking or no wheezing. This is a recurrent problem. The problem occurs rarely. Pertinent negatives include no fatigue. He has tried a PPI for the symptoms.    Lab Results  Component Value Date   NA 139 07/08/2021   K 4.3 07/08/2021   CO2 24 07/08/2021   GLUCOSE 84 07/08/2021   BUN 11 07/08/2021   CREATININE 1.05 07/08/2021   CALCIUM 9.4 07/08/2021   EGFR 93 07/08/2021   GFRNONAA 95 08/31/2018   Lab Results  Component Value Date   CHOL 156 07/08/2021   HDL 48 07/08/2021   LDLCALC 91 07/08/2021   TRIG 88 07/08/2021   CHOLHDL 3.3 07/08/2021   Lab Results  Component Value Date   TSH 1.480 07/08/2021   No results found for: "HGBA1C" Lab Results  Component Value Date   WBC 4.7 07/08/2021   HGB 15.0 07/08/2021   HCT 44.1 07/08/2021   MCV 98 (H) 07/08/2021   PLT 265 07/08/2021   Lab Results  Component Value Date    ALT 19 07/08/2021   AST 19 07/08/2021   ALKPHOS 89 07/08/2021   BILITOT 1.6 (H) 07/08/2021   No results found for: "25OHVITD2", "25OHVITD3", "VD25OH"   Review of Systems  Constitutional:  Negative for appetite change, chills, diaphoresis, fatigue and unexpected weight change.  HENT:  Positive for trouble swallowing. Negative for hearing loss.   Eyes:  Negative for visual disturbance.  Respiratory:  Negative for choking, shortness of breath and wheezing.   Cardiovascular:  Negative for chest pain, palpitations and leg swelling.  Gastrointestinal:  Positive for constipation and heartburn. Negative for abdominal pain, blood in stool and diarrhea.  Genitourinary:  Negative for difficulty urinating, dysuria and frequency.  Musculoskeletal:  Positive for arthralgias. Negative for back pain and myalgias.  Skin:  Negative for color change and rash.  Neurological:  Negative for dizziness, syncope and headaches.  Hematological:  Negative for adenopathy.  Psychiatric/Behavioral:  Negative for dysphoric mood and sleep disturbance. The patient is not nervous/anxious.     Patient Active Problem List   Diagnosis Date Noted   Chondromalacia of knee, left 02/25/2022   Recurrent left knee instability 01/29/2022   Chronic idiopathic constipation 07/08/2021   Calcific tendonitis of left lower leg 07/01/2021   Gastroesophageal reflux disease 09/12/2020   Hamstring tightness of left lower extremity 06/04/2020   Pes anserinus bursitis of right knee 05/09/2020   Chronic  sinusitis, unspecified 08/31/2018    Allergies  Allergen Reactions   Gadolinium Derivatives Hives   Iodinated Contrast Media Hives    Past Surgical History:  Procedure Laterality Date   CHOLECYSTECTOMY  2006   ESOPHAGEAL MANOMETRY N/A 02/04/2021   Procedure: ESOPHAGEAL MANOMETRY (EM);  Surgeon: Napoleon Form, MD;  Location: [facility];  Service: Endoscopy;  Laterality: N/A;   ESOPHAGOGASTRODUODENOSCOPY      ESOPHAGOGASTRODUODENOSCOPY (EGD) WITH PROPOFOL N/A 02/22/2019   Procedure: ESOPHAGOGASTRODUODENOSCOPY (EGD) WITH PROPOFOL;  Surgeon: Toledo, Boykin Nearing, MD;  Location: [facility] ENDOSCOPY;  Service: Gastroenterology;  Laterality: N/A;   ESOPHAGOGASTRODUODENOSCOPY (EGD) WITH PROPOFOL N/A 01/15/2020   Procedure: ESOPHAGOGASTRODUODENOSCOPY (EGD) WITH PROPOFOL;  Surgeon: Regis Bill, MD;  Location: [facility] ENDOSCOPY;  Service: Endoscopy;  Laterality: N/A;   ESOPHAGOGASTRODUODENOSCOPY (EGD) WITH PROPOFOL N/A 12/18/2020   Procedure: ESOPHAGOGASTRODUODENOSCOPY (EGD) WITH PROPOFOL;  Surgeon: Jaynie Collins, DO;  Location: [facility] ENDOSCOPY;  Service: Gastroenterology;  Laterality: N/A;   [place] IMPEDANCE STUDY N/A 02/04/2021   Procedure: [place] IMPEDANCE STUDY;  Surgeon: Napoleon Form, MD;  Location: [facility] ENDOSCOPY;  Service: Endoscopy;  Laterality: N/A;   ULNAR NERVE TRANSPOSITION Bilateral 2019   cubital tunnel release    Social History   Tobacco Use   Smoking status: Former    Types: Cigars   Smokeless tobacco: Former  Building services engineer Use: Never used  Substance Use Topics   Alcohol use: Yes    Alcohol/week: 5.0 - 7.0 standard drinks of alcohol    Types: 5 - 7 Cans of beer per week   Drug use: No     Medication list has been reviewed and updated.  Current Meds  Medication Sig   omeprazole (PRILOSEC) 40 MG capsule Take 1 capsule (40 mg total) by mouth 2 (two) times daily before a meal.   [DISCONTINUED] Vitamin D, Ergocalciferol, (DRISDOL) 1.25 MG (50000 UNIT) CAPS capsule Take 1 capsule (50,000 Units total) by mouth every 7 (seven) days.       07/13/2022    8:06 AM 02/08/2022   10:38 AM 07/08/2021    8:03 AM 06/30/2020    8:50 AM  GAD 7 : Generalized Anxiety Score  Nervous, Anxious, on Edge 0 0 0 0  Control/stop worrying 0 0 0 0  Worry too much - different things 0 0 0 0  Trouble relaxing 0 0 0 0  Restless 0 0 0 0  Easily annoyed or irritable 0 0 0 0  Afraid - awful might  happen 0 0 0 0  Total GAD 7 Score 0 0 0 0  Anxiety Difficulty Not difficult at all Not difficult at all Not difficult at all Not difficult at all       07/13/2022    8:06 AM 02/08/2022   10:38 AM 07/08/2021    8:03 AM  Depression screen PHQ 2/9  Decreased Interest 0 0 0  Down, Depressed, Hopeless 0 0 0  PHQ - 2 Score 0 0 0  Altered sleeping 0 0 0  Tired, decreased energy 0 0 0  Change in appetite 0 0 0  Feeling bad or failure about yourself  0 0 0  Trouble concentrating 0 0 0  Moving slowly or fidgety/restless 0 0 0  Suicidal thoughts 0 0 0  PHQ-9 Score 0 0 0  Difficult doing work/chores Not difficult at all Not difficult at all Not difficult at all    BP Readings from Last 3 Encounters:  07/13/22 122/74  04/02/22 116/84  02/25/22 122/84    Physical Exam Vitals and nursing note reviewed.  Constitutional:      Appearance: Normal appearance. He is well-developed.  HENT:     Head: Normocephalic.     Right Ear: Tympanic membrane, ear canal and external ear normal.     Left Ear: Tympanic membrane, ear canal and external ear normal.     Nose: Nose normal.  Eyes:     Conjunctiva/sclera: Conjunctivae normal.     Pupils: Pupils are equal, round, and reactive to light.  Neck:     Thyroid: No thyromegaly.     Vascular: No carotid bruit.  Cardiovascular:     Rate and Rhythm: Normal rate and regular rhythm.     Pulses: Normal pulses.     Heart sounds: Normal heart sounds.  Pulmonary:     Effort: Pulmonary effort is normal.     Breath sounds: Normal breath sounds. No wheezing.  Chest:  Breasts:    Right: No mass.     Left: No mass.  Abdominal:     General: Bowel sounds are normal. There is no distension.     Palpations: Abdomen is soft. There is no mass.     Tenderness: There is no abdominal tenderness.  Musculoskeletal:        General: Normal range of motion.     Cervical back: Normal range of motion and neck supple.     Right lower leg: No edema.     Left lower leg:  No edema.  Lymphadenopathy:     Cervical: No cervical adenopathy.  Skin:    General: Skin is warm and dry.     Capillary Refill: Capillary refill takes less than 2 seconds.  Neurological:     General: No focal deficit present.     Mental Status: He is alert and oriented to person, place, and time.     Deep Tendon Reflexes: Reflexes are normal and symmetric.  Psychiatric:        Attention and Perception: Attention normal.        Mood and Affect: Mood normal.        Thought Content: Thought content normal.     Wt Readings from Last 3 Encounters:  07/13/22 174 lb (78.9 kg)  04/02/22 169 lb (76.7 kg)  02/25/22 173 lb (78.5 kg)    BP 122/74   Pulse 84   Ht 5\' 10"  (1.778 m)   Wt 174 lb (78.9 kg)   SpO2 99%   BMI 24.97 kg/m   Assessment and Plan:  Problem List Items Addressed This Visit     Gastroesophageal reflux disease    Reflux symptoms are minimal on current therapy - omeprazole. He still has some swallowing/throat tightness but could not afford Motegrity No red flag signs such as weight loss, n/v, melena       Relevant Orders   CBC with Differential/Platelet   Chronic idiopathic constipation   Chondromalacia of knee, left    Injured last year doing an Iron Man competition. Improving after injections; still able to exercise regularly      Other Visit Diagnoses     Annual physical exam    -  Primary   Relevant Orders   CBC with Differential/Platelet   Comprehensive metabolic panel   Lipid panel   Hemoglobin A1c   TSH   Screening for diabetes mellitus       Relevant Orders   Comprehensive metabolic panel   Hemoglobin A1c   Screening for lipid disorders  Relevant Orders   Lipid panel       No follow-ups on file.   Partially dictated using Dragon software, any errors are not intentional.  Reubin Milan, MD [facility] [place], [place]

## 2022-07-14 LAB — COMPREHENSIVE METABOLIC PANEL
ALT: 16 IU/L (ref 0–44)
AST: 18 IU/L (ref 0–40)
Albumin/Globulin Ratio: 2 (ref 1.2–2.2)
Albumin: 4.7 g/dL (ref 4.1–5.1)
Alkaline Phosphatase: 100 IU/L (ref 44–121)
BUN/Creatinine Ratio: 16 (ref 9–20)
BUN: 17 mg/dL (ref 6–24)
Bilirubin Total: 1.8 mg/dL — ABNORMAL HIGH (ref 0.0–1.2)
CO2: 25 mmol/L (ref 20–29)
Calcium: 9.8 mg/dL (ref 8.7–10.2)
Chloride: 102 mmol/L (ref 96–106)
Creatinine, Ser: 1.04 mg/dL (ref 0.76–1.27)
Globulin, Total: 2.4 g/dL (ref 1.5–4.5)
Glucose: 92 mg/dL (ref 70–99)
Potassium: 4.5 mmol/L (ref 3.5–5.2)
Sodium: 139 mmol/L (ref 134–144)
Total Protein: 7.1 g/dL (ref 6.0–8.5)
eGFR: 93 mL/min/{1.73_m2} (ref >59)

## 2022-07-14 LAB — CBC WITH DIFFERENTIAL/PLATELET
Basophils Absolute: 0 10*3/uL (ref 0.0–0.2)
Basos: 0 %
EOS (ABSOLUTE): 0.2 10*3/uL (ref 0.0–0.4)
Eos: 3 %
Hematocrit: 45.6 % (ref 37.5–51.0)
Hemoglobin: 15.4 g/dL (ref 13.0–17.7)
Immature Grans (Abs): 0 10*3/uL (ref 0.0–0.1)
Immature Granulocytes: 0 %
Lymphocytes Absolute: 1.7 10*3/uL (ref 0.7–3.1)
Lymphs: 35 %
MCH: 33.8 pg — ABNORMAL HIGH (ref 26.6–33.0)
MCHC: 33.8 g/dL (ref 31.5–35.7)
MCV: 100 fL — ABNORMAL HIGH (ref 79–97)
Monocytes Absolute: 0.4 10*3/uL (ref 0.1–0.9)
Monocytes: 8 %
Neutrophils Absolute: 2.7 10*3/uL (ref 1.4–7.0)
Neutrophils: 54 %
Platelets: 249 10*3/uL (ref 150–450)
RBC: 4.56 x10E6/uL (ref 4.14–5.80)
RDW: 11.6 % (ref 11.6–15.4)
WBC: 5 10*3/uL (ref 3.4–10.8)

## 2022-07-14 LAB — TSH: TSH: 1.48 u[IU]/mL (ref 0.450–4.500)

## 2022-07-14 LAB — HEMOGLOBIN A1C
Est. average glucose Bld gHb Est-mCnc: 82 mg/dL
Hgb A1c MFr Bld: 4.5 % — ABNORMAL LOW (ref 4.8–5.6)

## 2022-07-14 LAB — LIPID PANEL
Chol/HDL Ratio: 3.6 ratio (ref 0.0–5.0)
Cholesterol, Total: 168 mg/dL (ref 100–199)
HDL: 47 mg/dL (ref >39)
LDL Chol Calc (NIH): 108 mg/dL — ABNORMAL HIGH (ref 0–99)
Triglycerides: 69 mg/dL (ref 0–149)
VLDL Cholesterol Cal: 13 mg/dL (ref 5–40)

## 2022-11-05 ENCOUNTER — Other Ambulatory Visit: Payer: Self-pay

## 2023-05-30 DIAGNOSIS — H52223 Regular astigmatism, bilateral: Secondary | ICD-10-CM | POA: Diagnosis not present

## 2023-05-30 DIAGNOSIS — H5213 Myopia, bilateral: Secondary | ICD-10-CM | POA: Diagnosis not present

## 2023-07-15 ENCOUNTER — Encounter: Payer: Self-pay | Admitting: Internal Medicine

## 2023-07-20 ENCOUNTER — Encounter: Payer: Self-pay | Admitting: Internal Medicine

## 2023-07-20 ENCOUNTER — Other Ambulatory Visit: Payer: Self-pay

## 2023-07-20 ENCOUNTER — Ambulatory Visit (INDEPENDENT_AMBULATORY_CARE_PROVIDER_SITE_OTHER): Admitting: Internal Medicine

## 2023-07-20 VITALS — BP 124/86 | HR 66 | Ht 70.0 in | Wt 186.4 lb

## 2023-07-20 DIAGNOSIS — K2100 Gastro-esophageal reflux disease with esophagitis, without bleeding: Secondary | ICD-10-CM

## 2023-07-20 DIAGNOSIS — R339 Retention of urine, unspecified: Secondary | ICD-10-CM | POA: Diagnosis not present

## 2023-07-20 DIAGNOSIS — Z0000 Encounter for general adult medical examination without abnormal findings: Secondary | ICD-10-CM

## 2023-07-20 DIAGNOSIS — E785 Hyperlipidemia, unspecified: Secondary | ICD-10-CM | POA: Diagnosis not present

## 2023-07-20 MED ORDER — OMEPRAZOLE 40 MG PO CPDR
40.0000 mg | DELAYED_RELEASE_CAPSULE | Freq: Two times a day (BID) | ORAL | 1 refills | Status: AC
  Filled 2023-07-20: qty 180, 90d supply, fill #0

## 2023-07-20 MED ORDER — TAMSULOSIN HCL 0.4 MG PO CAPS
0.4000 mg | ORAL_CAPSULE | Freq: Every day | ORAL | 0 refills | Status: DC
  Filled 2023-07-20: qty 30, 30d supply, fill #0

## 2023-07-20 NOTE — Progress Notes (Signed)
 Date:  07/20/2023   Name:  Charles Simmons   DOB:  June 20, 1980   MRN:  [id number]   Chief Complaint: Annual Exam (Going to use the restroom more at night, happening over the past year, feels like he does not empty his bladder) Charles Simmons is a 43 y.o. male who presents today for his Complete Annual Exam. He feels well. He reports exercising cross fit and rides bike, 3 - 5 times a week. He reports he is sleeping fairly well.   Health Maintenance  Topic Date Due   HIV Screening  Never done   COVID-19 Vaccine (5 - 2024-25 season) 10/24/2022   Flu Shot  09/23/2023   DTaP/Tdap/Td vaccine (2 - Td or Tdap) 12/27/2024   Hepatitis C Screening  Completed   HPV Vaccine  Aged Out   Meningitis B Vaccine  Aged Out    No results found for: "PSA1", "PSA"   Gastroesophageal Reflux He complains of heartburn. He reports no abdominal pain, no chest pain, no coughing or no wheezing. This is a recurrent problem. The problem occurs occasionally. Pertinent negatives include no fatigue. He has tried a PPI for the symptoms. The treatment provided significant relief.  Urinary Frequency  This is a chronic problem. The current episode started more than 1 year ago. Episode frequency: usually in the late day or evening. The patient is experiencing no pain. There has been no fever. He is Sexually active. Associated symptoms include frequency. Pertinent negatives include no hematuria or urgency. Associated symptoms comments: Incomplete emptying -. He has tried nothing for the symptoms.    Review of Systems  Constitutional:  Negative for fatigue and unexpected weight change.  HENT:  Negative for nosebleeds.   Eyes:  Negative for visual disturbance.  Respiratory:  Negative for cough, chest tightness, shortness of breath and wheezing.   Cardiovascular:  Negative for chest pain, palpitations and leg swelling.  Gastrointestinal:  Positive for heartburn. Negative for abdominal pain, constipation and  diarrhea.  Genitourinary:  Positive for frequency. Negative for hematuria and urgency.  Musculoskeletal:  Negative for arthralgias.  Skin:  Negative for color change and rash.  Neurological:  Negative for dizziness, weakness, light-headedness and headaches.  Psychiatric/Behavioral:  Negative for dysphoric mood and sleep disturbance. The patient is not nervous/anxious.      Lab Results  Component Value Date   NA 139 07/13/2022   K 4.5 07/13/2022   CO2 25 07/13/2022   GLUCOSE 92 07/13/2022   BUN 17 07/13/2022   CREATININE 1.04 07/13/2022   CALCIUM 9.8 07/13/2022   EGFR 93 07/13/2022   GFRNONAA 95 08/31/2018   Lab Results  Component Value Date   CHOL 168 07/13/2022   HDL 47 07/13/2022   LDLCALC 108 (H) 07/13/2022   TRIG 69 07/13/2022   CHOLHDL 3.6 07/13/2022   Lab Results  Component Value Date   TSH 1.480 07/13/2022   Lab Results  Component Value Date   HGBA1C 4.5 (L) 07/13/2022   Lab Results  Component Value Date   WBC 5.0 07/13/2022   HGB 15.4 07/13/2022   HCT 45.6 07/13/2022   MCV 100 (H) 07/13/2022   PLT 249 07/13/2022   Lab Results  Component Value Date   ALT 16 07/13/2022   AST 18 07/13/2022   ALKPHOS 100 07/13/2022   BILITOT 1.8 (H) 07/13/2022   No results found for: "25OHVITD2", "25OHVITD3", "VD25OH"   Patient Active Problem List   Diagnosis Date Noted   Mild hyperlipidemia 07/20/2023  Incomplete emptying of bladder 07/20/2023   Chondromalacia of knee, left 02/25/2022   Recurrent left knee instability 01/29/2022   Chronic idiopathic constipation 07/08/2021   Calcific tendonitis of left lower leg 07/01/2021   Gastroesophageal reflux disease with esophagitis 09/12/2020   Pes anserinus bursitis of right knee 05/09/2020   Chronic sinusitis, unspecified 08/31/2018    Allergies  Allergen Reactions   Gadolinium Derivatives Hives   Iodinated Contrast Media Hives    Past Surgical History:  Procedure Laterality Date   CHOLECYSTECTOMY  2006    ESOPHAGEAL MANOMETRY N/A 02/04/2021   Procedure: ESOPHAGEAL MANOMETRY (EM);  Surgeon: Nandigam, Kavitha V, MD;  Location: [facility];  Service: Endoscopy;  Laterality: N/A;   ESOPHAGOGASTRODUODENOSCOPY     ESOPHAGOGASTRODUODENOSCOPY (EGD) WITH PROPOFOL  N/A 02/22/2019   Procedure: ESOPHAGOGASTRODUODENOSCOPY (EGD) WITH PROPOFOL ;  Surgeon: Toledo, Alphonsus Jeans, MD;  Location: [facility] ENDOSCOPY;  Service: Gastroenterology;  Laterality: N/A;   ESOPHAGOGASTRODUODENOSCOPY (EGD) WITH PROPOFOL  N/A 01/15/2020   Procedure: ESOPHAGOGASTRODUODENOSCOPY (EGD) WITH PROPOFOL ;  Surgeon: Shane Darling, MD;  Location: [facility];  Service: Endoscopy;  Laterality: N/A;   ESOPHAGOGASTRODUODENOSCOPY (EGD) WITH PROPOFOL  N/A 12/18/2020   Procedure: ESOPHAGOGASTRODUODENOSCOPY (EGD) WITH PROPOFOL ;  Surgeon: Quintin Buckle, DO;  Location[facility] END[facility]COPY;  Service: Gastroenterology;  Laterality: N/A;   PH IMPEDANCE STUDY N/A 02/04/2021   Procedure: PH IMPEDANCE STUDY;  Surgeon: Sergio Dandy, MD;  Location: [facility];  Service: Endoscopy;  Laterality: N/A;   ULNAR NERVE TRANSPOSITION Bilateral 2019   cubital tunnel release    Social History   Tobacco Use   Smoking status: Former    Types: Cigars   Smokeless tobacco: Former  Building services engineer status: Never Used  Substance Use Topics   Alcohol use: Yes    Alcohol/week: 5.0 - 7.0 standard drinks of alcohol    Types: 5 - 7 Cans of beer per week   Drug use: No     Medication list has been reviewed and updated.  Current Meds  Medication Sig   fluticasone (FLONASE) 50 MCG/ACT nasal spray Place into both nostrils daily.   melatonin 5 MG TABS Take 5 mg by mouth as needed.   tamsulosin (FLOMAX) 0.4 MG CAPS capsule Take 1 capsule (0.4 mg total) by mouth daily after supper.   [DISCONTINUED] omeprazole  (PRILOSEC) 40 MG capsule Take 1 capsule (40 mg total) by mouth 2 (two) times daily before a meal.       07/20/2023   10:27 AM 07/13/2022     8:06 AM 02/08/2022   10:38 AM 07/08/2021    8:03 AM  GAD 7 : Generalized Anxiety Score  Nervous, Anxious, on Edge 0 0 0 0  Control/stop worrying 0 0 0 0  Worry too much - different things 0 0 0 0  Trouble relaxing 0 0 0 0  Restless 0 0 0 0  Easily annoyed or irritable 0 0 0 0  Afraid - awful might happen 0 0 0 0  Total GAD 7 Score 0 0 0 0  Anxiety Difficulty Not difficult at all Not difficult at all Not difficult at all Not difficult at all       07/20/2023   10:26 AM 07/13/2022    8:06 AM 02/08/2022   10:38 AM  Depression screen PHQ 2/9  Decreased Interest 0 0 0  Down, Depressed, Hopeless 0 0 0  PHQ - 2 Score 0 0 0  Altered sleeping 1 0 0  Tired, decreased energy 0 0 0  Change in  appetite 0 0 0  Feeling bad or failure about yourself  0 0 0  Trouble concentrating 0 0 0  Moving slowly or fidgety/restless 0 0 0  Suicidal thoughts 0 0 0  PHQ-9 Score 1 0 0  Difficult doing work/chores Not difficult at all Not difficult at all Not difficult at all    BP Readings from Last 3 Encounters:  07/20/23 124/86  07/13/22 122/74  04/02/22 116/84    Physical Exam Vitals and nursing note reviewed.  Constitutional:      Appearance: Normal appearance. He is well-developed.  HENT:     Head: Normocephalic.     Right Ear: Tympanic membrane, ear canal and external ear normal.     Left Ear: Tympanic membrane, ear canal and external ear normal.     Nose: Nose normal.  Eyes:     Conjunctiva/sclera: Conjunctivae normal.     Pupils: Pupils are equal, round, and reactive to light.  Neck:     Thyroid: No thyromegaly.     Vascular: No carotid bruit.  Cardiovascular:     Rate and Rhythm: Normal rate and regular rhythm.     Heart sounds: Normal heart sounds.  Pulmonary:     Effort: Pulmonary effort is normal.     Breath sounds: Normal breath sounds. No wheezing.  Chest:  Breasts:    Right: No mass.     Left: No mass.  Abdominal:     General: Bowel sounds are normal.     Palpations:  Abdomen is soft.     Tenderness: There is no abdominal tenderness.  Genitourinary:    Prostate: Normal. Not enlarged, not tender and no nodules present.     Rectum: Normal.  Musculoskeletal:        General: Normal range of motion.     Cervical back: Normal range of motion and neck supple.  Lymphadenopathy:     Cervical: No cervical adenopathy.  Skin:    General: Skin is warm and dry.  Neurological:     Mental Status: He is alert and oriented to person, place, and time.     Deep Tendon Reflexes: Reflexes are normal and symmetric.  Psychiatric:        Attention and Perception: Attention normal.        Mood and Affect: Mood normal.        Thought Content: Thought content normal.     Wt Readings from Last 3 Encounters:  07/20/23 186 lb 6 oz (84.5 kg)  07/13/22 174 lb (78.9 kg)  04/02/22 169 lb (76.7 kg)    BP 124/86   Pulse 66   Ht 5\' 10"  (1.778 m)   Wt 186 lb 6 oz (84.5 kg)   SpO2 98%   BMI 26.74 kg/m   Assessment and Plan:  Problem List Items Addressed This Visit       Unprioritized   Gastroesophageal reflux disease with esophagitis   Relevant Medications   tamsulosin (FLOMAX) 0.4 MG CAPS capsule   Other Relevant Orders   CBC with Differential/Platelet   Mild hyperlipidemia   Managed with diet changes and exercise. Lab Results  Component Value Date   LDLCALC 108 (H) 07/13/2022         Relevant Orders   Lipid panel   Incomplete emptying of bladder   Normal prostate exam; Will check PSA, Flomax nightly If no benefit, will refer to Urology      Relevant Medications   tamsulosin (FLOMAX) 0.4 MG CAPS capsule   Other  Relevant Orders   PSA   Other Visit Diagnoses       Annual physical exam    -  Primary   Continue healthy diet and exercise. up to date on screenings and immunizations GI advises colonoscopy at age 80 despite family hx   Relevant Orders   CBC with Differential/Platelet   Comprehensive metabolic panel with GFR   Lipid panel   TSH        No follow-ups on file.    Sheron Dixons, MD [facility] [place]

## 2023-07-20 NOTE — Assessment & Plan Note (Signed)
 Normal prostate exam; Will check PSA, Flomax nightly If no benefit, will refer to Urology

## 2023-07-20 NOTE — Assessment & Plan Note (Signed)
 Managed with diet changes and exercise. Lab Results  Component Value Date   LDLCALC 108 (H) 07/13/2022

## 2023-07-21 ENCOUNTER — Ambulatory Visit: Payer: Self-pay | Admitting: Internal Medicine

## 2023-07-21 LAB — COMPREHENSIVE METABOLIC PANEL WITH GFR
ALT: 23 IU/L (ref 0–44)
AST: 19 IU/L (ref 0–40)
Albumin: 4.5 g/dL (ref 4.1–5.1)
Alkaline Phosphatase: 102 IU/L (ref 44–121)
BUN/Creatinine Ratio: 11 (ref 9–20)
BUN: 13 mg/dL (ref 6–24)
Bilirubin Total: 1.6 mg/dL — ABNORMAL HIGH (ref 0.0–1.2)
CO2: 23 mmol/L (ref 20–29)
Calcium: 9.8 mg/dL (ref 8.7–10.2)
Chloride: 100 mmol/L (ref 96–106)
Creatinine, Ser: 1.22 mg/dL (ref 0.76–1.27)
Globulin, Total: 2.9 g/dL (ref 1.5–4.5)
Glucose: 107 mg/dL — ABNORMAL HIGH (ref 70–99)
Potassium: 4.7 mmol/L (ref 3.5–5.2)
Sodium: 138 mmol/L (ref 134–144)
Total Protein: 7.4 g/dL (ref 6.0–8.5)
eGFR: 76 mL/min/{1.73_m2} (ref >59)

## 2023-07-21 LAB — CBC WITH DIFFERENTIAL/PLATELET
Basophils Absolute: 0 10*3/uL (ref 0.0–0.2)
Basos: 1 %
EOS (ABSOLUTE): 0.1 10*3/uL (ref 0.0–0.4)
Eos: 2 %
Hematocrit: 47.9 % (ref 37.5–51.0)
Hemoglobin: 15.8 g/dL (ref 13.0–17.7)
Immature Grans (Abs): 0 10*3/uL (ref 0.0–0.1)
Immature Granulocytes: 0 %
Lymphocytes Absolute: 1.6 10*3/uL (ref 0.7–3.1)
Lymphs: 28 %
MCH: 32.8 pg (ref 26.6–33.0)
MCHC: 33 g/dL (ref 31.5–35.7)
MCV: 99 fL — ABNORMAL HIGH (ref 79–97)
Monocytes Absolute: 0.5 10*3/uL (ref 0.1–0.9)
Monocytes: 9 %
Neutrophils Absolute: 3.4 10*3/uL (ref 1.4–7.0)
Neutrophils: 60 %
Platelets: 268 10*3/uL (ref 150–450)
RBC: 4.82 x10E6/uL (ref 4.14–5.80)
RDW: 11.8 % (ref 11.6–15.4)
WBC: 5.7 10*3/uL (ref 3.4–10.8)

## 2023-07-21 LAB — LIPID PANEL
Chol/HDL Ratio: 3.8 ratio (ref 0.0–5.0)
Cholesterol, Total: 171 mg/dL (ref 100–199)
HDL: 45 mg/dL (ref >39)
LDL Chol Calc (NIH): 113 mg/dL — ABNORMAL HIGH (ref 0–99)
Triglycerides: 70 mg/dL (ref 0–149)
VLDL Cholesterol Cal: 13 mg/dL (ref 5–40)

## 2023-07-21 LAB — TSH: TSH: 1.8 u[IU]/mL (ref 0.450–4.500)

## 2023-07-21 LAB — PSA: Prostate Specific Ag, Serum: 2.1 ng/mL (ref 0.0–4.0)

## 2023-09-19 ENCOUNTER — Other Ambulatory Visit: Payer: Self-pay | Admitting: Internal Medicine

## 2023-09-19 DIAGNOSIS — R339 Retention of urine, unspecified: Secondary | ICD-10-CM

## 2023-09-19 DIAGNOSIS — K2100 Gastro-esophageal reflux disease with esophagitis, without bleeding: Secondary | ICD-10-CM

## 2023-09-20 ENCOUNTER — Other Ambulatory Visit: Payer: Self-pay | Admitting: Internal Medicine

## 2023-09-20 ENCOUNTER — Other Ambulatory Visit: Payer: Self-pay

## 2023-09-20 DIAGNOSIS — K2100 Gastro-esophageal reflux disease with esophagitis, without bleeding: Secondary | ICD-10-CM

## 2023-09-20 DIAGNOSIS — R339 Retention of urine, unspecified: Secondary | ICD-10-CM

## 2023-09-21 ENCOUNTER — Other Ambulatory Visit: Payer: Self-pay

## 2023-09-21 MED FILL — Tamsulosin HCl Cap 0.4 MG: ORAL | 30 days supply | Qty: 30 | Fill #0 | Status: AC

## 2023-09-21 NOTE — Telephone Encounter (Signed)
 Requested medication (s) are due for refill today - provider review   Requested medication (s) are on the active medication list -yes  Future visit scheduled -no  Last refill: 07/20/23 #30  Notes to clinic: Provider review for continued /long term use  Requested Prescriptions  Pending Prescriptions Disp Refills   tamsulosin  (FLOMAX ) 0.4 MG CAPS capsule [Pharmacy Med Name: tamsulosin  (FLOMAX ) 0.4 MG Cap capsule] 30 capsule 0    Sig: Take 1 capsule (0.4 mg total) by mouth daily after supper.     Urology: Alpha-Adrenergic Blocker Passed - 09/21/2023  1:29 PM      Passed - PSA in normal range and within 360 days    Prostate Specific Ag, Serum  Date Value Ref Range Status  07/20/2023 2.1 0.0 - 4.0 ng/mL Final    Comment:    Roche ECLIA methodology. According to the American Urological Association, Serum PSA should decrease and remain at undetectable levels after radical prostatectomy. The AUA defines biochemical recurrence as an initial PSA value 0.2 ng/mL or greater followed by a subsequent confirmatory PSA value 0.2 ng/mL or greater. Values obtained with different assay methods or kits cannot be used interchangeably. Results cannot be interpreted as absolute evidence of the presence or absence of malignant disease.          Passed - Last BP in normal range    BP Readings from Last 1 Encounters:  07/20/23 124/86         Passed - Valid encounter within last 12 months    Recent Outpatient Visits           2 months ago Annual physical exam   [facility], Leita DEL, MD                 Requested Prescriptions  Pending Prescriptions Disp Refills   tamsulosin  (FLOMAX ) 0.4 MG CAPS capsule [Pharmacy Med Name: tamsulosin  (FLOMAX ) 0.4 MG Cap capsule] 30 capsule 0    Sig: Take 1 capsule (0.4 mg total) by mouth daily after supper.     Urology: Alpha-Adrenergic Blocker Passed - 09/21/2023  1:29 PM      Passed - PSA in  normal range and within 360 days    Prostate Specific Ag, Serum  Date Value Ref Range Status  07/20/2023 2.1 0.0 - 4.0 ng/mL Final    Comment:    Roche ECLIA methodology. According to the American Urological Association, Serum PSA should decrease and remain at undetectable levels after radical prostatectomy. The AUA defines biochemical recurrence as an initial PSA value 0.2 ng/mL or greater followed by a subsequent confirmatory PSA value 0.2 ng/mL or greater. Values obtained with different assay methods or kits cannot be used interchangeably. Results cannot be interpreted as absolute evidence of the presence or absence of malignant disease.          Passed - Last BP in normal range    BP Readings from Last 1 Encounters:  07/20/23 124/86         Passed - Valid encounter within last 12 months    Recent Outpatient Visits           2 months ago Annual physical exam   [facility], Leita DEL, MD

## 2023-09-22 ENCOUNTER — Other Ambulatory Visit: Payer: Self-pay

## 2023-09-23 ENCOUNTER — Other Ambulatory Visit: Payer: Self-pay

## 2023-10-31 ENCOUNTER — Other Ambulatory Visit: Payer: Self-pay

## 2023-10-31 ENCOUNTER — Telehealth: Payer: Self-pay

## 2023-10-31 MED ORDER — COVID-19 MRNA VAC-TRIS(PFIZER) 30 MCG/0.3ML IM SUSY
0.3000 mL | PREFILLED_SYRINGE | Freq: Once | INTRAMUSCULAR | 0 refills | Status: AC
  Filled 2023-10-31 – 2023-11-28 (×2): qty 0.3, 1d supply, fill #0

## 2023-10-31 NOTE — Telephone Encounter (Signed)
 Called pt and left VM informing covid vaccine sent. Due for next TDAP Nov 2026.

## 2023-10-31 NOTE — Telephone Encounter (Signed)
 Copied from CRM [id number]. Topic: General - Other >> Oct 31, 2023 11:19 AM Suzen RAMAN wrote: Reason for RMF:Ejupzwu would like to know when he is due for his next T-Dap injection and if he is able to be scheduled for injection. Patient would also like a prescription sent to his pharmacy for him to have his COVID vaccine.  cb# [phone]

## 2023-11-03 NOTE — Progress Notes (Signed)
 [facility] [address] [place] [address] Phone: [phone] Subjective:   ISusannah Simmons, am serving as a scribe for Dr. Arthea Claudene.  I'm seeing this patient by the request  of:  Justus Leita DEL, MD  CC: Right knee pain  YEP:Dlagzrupcz  Charles Simmons is a 43 y.o. male coming in with complaint of R knee pain. Hx of pes anserine bursitis R knee 2022.  Patient is also has been seen for chondromalacia of the left knee a year ago.  Patient states similar feeling that he had in L knee.  Was snatching and box jumps last week. No real MOI but has felt different since then. Extension doesn't hurt, but coming back into flexion feels like it catches. Cannot step down onto R knee and pain is lateral.     Reviewing patient's previous office notes and most recent office notes of internal medicine was seen for reflux disease.  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   Dx'd 2012 - had some headaches but improved MRI 2019 - Normal, ethmoid sinusitis   Cholecystitis    Eosinophilic esophagitis    Eosinophilic esophagitis 02/25/2017   GERD (gastroesophageal reflux disease)    Hamstring tightness of left lower extremity 06/04/2020   Left medial tibial stress syndrome 04/01/2020   Renal infection    Stress reaction of shaft of femur, right, initial encounter 12/17/2021   Past Surgical History:  Procedure Laterality Date   CHOLECYSTECTOMY  2006   ESOPHAGEAL MANOMETRY N/A 02/04/2021   Procedure: ESOPHAGEAL MANOMETRY (EM);  Surgeon: Shila Gustav GAILS, MD;  Location: [facility];  Service: Endoscopy;  Laterality: N/A;   ESOPHAGOGASTRODUODENOSCOPY     ESOPHAGOGASTRODUODENOSCOPY (EGD) WITH PROPOFOL  N/A 02/22/2019   Procedure: ESOPHAGOGASTRODUODENOSCOPY (EGD) WITH PROPOFOL ;  Surgeon: Toledo, Ladell POUR, MD;  Location: [facility] ENDOSCOPY;  Service: Gastroenterology;  Laterality:  N/A;   ESOPHAGOGASTRODUODENOSCOPY (EGD) WITH PROPOFOL  N/A 01/15/2020   Procedure: ESOPHAGOGASTRODUODENOSCOPY (EGD) WITH PROPOFOL ;  Surgeon: Maryruth Ole DASEN, MD;  Location: [facility] END[facility]COPY;  Service: Endoscopy;  Laterality: N/A;   ESOPHAGOGASTRODUODENOSCOPY (EGD) WITH PROPOFOL  N/A 12/18/2020   Procedure: ESOPHAGOGASTRODUODENOSCOPY (EGD) WITH PROPOFOL ;  Surgeon: Onita Elspeth Sharper, DO;  Location: [facility] ENDOSCOPY;  Service: Gastroenterology;  Laterality: N/A;   PH IMPEDANCE STUDY N/A 02/04/2021   Procedure: PH IMPEDANCE STUDY;  Surgeon: Shila Gustav GAILS, MD;  Location: [facility];  Service: Endoscopy;  Laterality: N/A;   ULNAR NERVE TRANSPOSITION Bilateral 2019   cubital tunnel release   Social History   Socioeconomic History   Marital status: Married    Spouse name: Rockie Moats   Number of children: 0   Years of education: Not on file   Highest education level: Not on file  Occupational History   Not on file  Tobacco Use   Smoking status: Former    Types: Cigars   Smokeless tobacco: Former  Building services engineer status: Never Used  Substance and Sexual Activity   Alcohol use: Yes    Alcohol/week: 5.0 - 7.0 standard drinks of alcohol    Types: 5 - 7 Cans of beer per week   Drug use: No   Sexual activity: Yes    Partners: Female  Other Topics Concern   Not on file  Social History Narrative   Not on file   Social Drivers of Health   Financial Resource Strain: Low Risk  (02/08/2022)   Overall  Financial Resource Strain (CARDIA)    Difficulty of Paying Living Expenses: Not hard at all  Food Insecurity: No Food Insecurity (02/08/2022)   Hunger Vital Sign    Worried About Running Out of Food in the Last Year: Never true    Ran Out of Food in the Last Year: Never true  Transportation Needs: No Transportation Needs (02/08/2022)   PRAPARE - Administrator, Civil Service (Medical): No    Lack of Transportation (Non-Medical): No  Physical Activity: Not on file   Stress: Not on file  Social Connections: Not on file   Allergies  Allergen Reactions   Gadolinium Derivatives Hives   Iodinated Contrast Media Hives   Family History  Problem Relation Age of Onset   Diabetes Mother    Crohn's disease Father 27   Colon polyps Father    Cancer Maternal Grandmother    Colon cancer Maternal Grandmother       Current Outpatient Medications (Respiratory):    fluticasone (FLONASE) 50 MCG/ACT nasal spray, Place into both nostrils daily.    Current Outpatient Medications (Other):    melatonin 5 MG TABS, Take 5 mg by mouth as needed.   omeprazole  (PRILOSEC) 40 MG capsule, Take 1 capsule (40 mg total) by mouth 2 (two) times daily before a meal.   tamsulosin  (FLOMAX ) 0.4 MG CAPS capsule, Take 1 capsule (0.4 mg total) by mouth daily after supper.   Reviewed prior external information including notes and imaging from  primary care provider As well as notes that were available from care everywhere and other healthcare systems.  Past medical history, social, surgical and family history all reviewed in electronic medical record.  No pertanent information unless stated regarding to the chief complaint.   Review of Systems:  No headache, visual changes, nausea, vomiting, diarrhea, constipation, dizziness, abdominal pain, skin rash, fevers, chills, night sweats, weight loss, swollen lymph nodes, body aches, joint swelling, chest pain, shortness of breath, mood changes. POSITIVE muscle aches  Objective  Blood pressure 116/86, pulse 76, height 5' 10 (1.778 m), weight 182 lb (82.6 kg), SpO2 97%.   General: No apparent distress alert and oriented x3 mood and affect normal, dressed appropriately.  HEENT: Pupils equal, extraocular movements intact  Respiratory: Patient's speak in full sentences and does not appear short of breath  Cardiovascular: No lower extremity edema, non tender, no erythema  Knee exam shows ttp over lateral aspect of knee positve grind  test  Politeal fossa tender, no mass  Patient does have some difficulty with full flexion as well as full extension but seems to be the most painful when at 20 degrees of flexion.  Limited muscular skeletal ultrasound was performed and interpreted by CLAUDENE HUSSAR, M  Limited ultrasound shows patient does have some hypoechoic changes in the patellofemoral joint.  Patient also has hypoechoic changes noted in the popliteal area.  Seems to have some mild chronic subluxation noted.  After informed written and verbal consent, patient was seated on exam table. Right knee was prepped with alcohol swab and utilizing anterolateral approach, patient's right knee space was injected with 4:1  marcaine 0.5%: Kenalog  40mg /dL. Patient tolerated the procedure well without immediate complications.    Impression and Recommendations:     The above documentation has been reviewed and is accurate and complete Adisen Bennion M Limuel Nieblas, DO

## 2023-11-09 ENCOUNTER — Ambulatory Visit: Admitting: Family Medicine

## 2023-11-09 ENCOUNTER — Encounter: Payer: Self-pay | Admitting: Family Medicine

## 2023-11-09 ENCOUNTER — Other Ambulatory Visit: Payer: Self-pay

## 2023-11-09 VITALS — BP 116/86 | HR 76 | Ht 70.0 in | Wt 182.0 lb

## 2023-11-09 DIAGNOSIS — M76891 Other specified enthesopathies of right lower limb, excluding foot: Secondary | ICD-10-CM

## 2023-11-09 DIAGNOSIS — M25561 Pain in right knee: Secondary | ICD-10-CM | POA: Diagnosis not present

## 2023-11-09 NOTE — Assessment & Plan Note (Signed)
 Significant instability with swelling noted.  Causing patient to have some more increasing in subluxation with patient having some patellofemoral arthritis noted as well on ultrasound today.  Discussed with patient she will be moving at this moment and doing a lot of work on ladders as well as packing so decided to be more aggressive.  Given injection today and hopefully this this will make a significant improvement for some time.  Discussed icing regimen and home exercises, increase activity slowly.  Follow-up again in 6 to 8 weeks otherwise.  With patient moving we may need to find another provider in his new state

## 2023-11-09 NOTE — Patient Instructions (Addendum)
 Injection in knee Hamstring exercises 3 ibuprofen 3x a day for 3 days Congrats on next chapter Keep me updated

## 2023-11-11 ENCOUNTER — Other Ambulatory Visit: Payer: Self-pay

## 2023-11-28 ENCOUNTER — Other Ambulatory Visit: Payer: Self-pay
